# Patient Record
Sex: Female | Born: 1985 | Race: Black or African American | Hispanic: No | Marital: Single | Smoking: Former smoker
Health system: Southern US, Community
[De-identification: ages and names within clinical notes are randomized; demographics above are authoritative.]

## PROBLEM LIST (undated history)

## (undated) ENCOUNTER — Emergency Department (HOSPITAL_COMMUNITY): Discharge status: Absent without leave | Payer: Self-pay

## (undated) DIAGNOSIS — K219 Gastro-esophageal reflux disease without esophagitis: Secondary | ICD-10-CM

## (undated) DIAGNOSIS — R51 Headache: Secondary | ICD-10-CM

## (undated) DIAGNOSIS — R519 Headache, unspecified: Secondary | ICD-10-CM

## (undated) DIAGNOSIS — D649 Anemia, unspecified: Secondary | ICD-10-CM

## (undated) DIAGNOSIS — E119 Type 2 diabetes mellitus without complications: Secondary | ICD-10-CM

## (undated) DIAGNOSIS — N390 Urinary tract infection, site not specified: Secondary | ICD-10-CM

---

## 2012-10-27 ENCOUNTER — Emergency Department (HOSPITAL_COMMUNITY)
Admission: EM | Admit: 2012-10-27 | Discharge: 2012-10-27 | Disposition: A | Discharge status: Home / Self Care | Payer: No Typology Code available for payment source | Attending: Emergency Medicine | Admitting: Emergency Medicine

## 2012-10-27 ENCOUNTER — Encounter (HOSPITAL_COMMUNITY): Payer: Self-pay | Admitting: *Deleted

## 2012-10-27 DIAGNOSIS — Z87891 Personal history of nicotine dependence: Secondary | ICD-10-CM | POA: Insufficient documentation

## 2012-10-27 DIAGNOSIS — Z3490 Encounter for supervision of normal pregnancy, unspecified, unspecified trimester: Secondary | ICD-10-CM

## 2012-10-27 DIAGNOSIS — O9989 Other specified diseases and conditions complicating pregnancy, childbirth and the puerperium: Secondary | ICD-10-CM | POA: Insufficient documentation

## 2012-10-27 DIAGNOSIS — O210 Mild hyperemesis gravidarum: Secondary | ICD-10-CM | POA: Insufficient documentation

## 2012-10-27 DIAGNOSIS — Z79899 Other long term (current) drug therapy: Secondary | ICD-10-CM | POA: Insufficient documentation

## 2012-10-27 DIAGNOSIS — R519 Headache, unspecified: Secondary | ICD-10-CM

## 2012-10-27 DIAGNOSIS — Y9389 Activity, other specified: Secondary | ICD-10-CM | POA: Insufficient documentation

## 2012-10-27 DIAGNOSIS — R51 Headache: Secondary | ICD-10-CM | POA: Insufficient documentation

## 2012-10-27 DIAGNOSIS — Y92410 Unspecified street and highway as the place of occurrence of the external cause: Secondary | ICD-10-CM | POA: Insufficient documentation

## 2012-10-27 DIAGNOSIS — R110 Nausea: Secondary | ICD-10-CM

## 2012-10-27 LAB — POCT I-STAT, CHEM 8
BUN: 5 mg/dL — ABNORMAL LOW (ref 6–23)
Hemoglobin: 11.2 g/dL — ABNORMAL LOW (ref 12.0–15.0)
Sodium: 138 mEq/L (ref 135–145)
TCO2: 22 mmol/L (ref 0–100)

## 2012-10-27 LAB — URINALYSIS, ROUTINE W REFLEX MICROSCOPIC
Bilirubin Urine: NEGATIVE
Glucose, UA: NEGATIVE mg/dL
Hgb urine dipstick: NEGATIVE
Ketones, ur: NEGATIVE mg/dL
Leukocytes, UA: NEGATIVE
Nitrite: NEGATIVE
Protein, ur: NEGATIVE mg/dL
Specific Gravity, Urine: 1.029 (ref 1.005–1.030)
Urobilinogen, UA: 0.2 mg/dL (ref 0.0–1.0)
pH: 6 (ref 5.0–8.0)

## 2012-10-27 LAB — POCT PREGNANCY, URINE: Preg Test, Ur: POSITIVE — AB

## 2012-10-27 MED ORDER — PYRIDOXINE HCL 500 MG PO TABS: 500.0000 mg | ORAL_TABLET | Freq: Every day | ORAL | Status: DC
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
## 2012-10-28 NOTE — ED Provider Notes (Signed)
Medical screening examination/treatment/procedure(s) were performed by non-physician practitioner and as supervising physician I was immediately available for consultation/collaboration.  Jamela Cumbo, MD 10/28/12 2334 

## 2014-02-06 ENCOUNTER — Encounter (HOSPITAL_COMMUNITY): Payer: Self-pay | Admitting: *Deleted

## 2014-08-20 ENCOUNTER — Encounter (HOSPITAL_BASED_OUTPATIENT_CLINIC_OR_DEPARTMENT_OTHER): Payer: Self-pay | Admitting: Emergency Medicine

## 2014-08-20 ENCOUNTER — Emergency Department (HOSPITAL_BASED_OUTPATIENT_CLINIC_OR_DEPARTMENT_OTHER)
Admission: EM | Admit: 2014-08-20 | Discharge: 2014-08-20 | Disposition: A | Discharge status: Home / Self Care | Payer: Medicaid Other | Attending: Emergency Medicine | Admitting: Emergency Medicine

## 2014-08-20 DIAGNOSIS — Z79899 Other long term (current) drug therapy: Secondary | ICD-10-CM | POA: Diagnosis not present

## 2014-08-20 DIAGNOSIS — Z87891 Personal history of nicotine dependence: Secondary | ICD-10-CM | POA: Insufficient documentation

## 2014-08-20 DIAGNOSIS — R21 Rash and other nonspecific skin eruption: Secondary | ICD-10-CM | POA: Insufficient documentation

## 2014-08-20 MED ORDER — FLUCONAZOLE 200 MG PO TABS: 200.0000 mg | ORAL_TABLET | Freq: Every day | ORAL | Status: DC

## 2014-08-20 MED ORDER — SELENIUM SULFIDE 1 % EX LOTN: 1.0000 "application " | TOPICAL_LOTION | Freq: Every day | CUTANEOUS | Status: DC

## 2014-08-20 NOTE — ED Provider Notes (Signed)
CSN: [id number]     Arrival date & time 08/20/14  1729 History  This chart is scribed for non-physician practitioner, Joycie PeekBenjamin Cormick Moss, PA-C, working with Jerelyn ScottMartha Linker, MD by Abel PrestoKara Demonbreun, ED Scribe.  This patient was seen in room MH09/MH09 and the patient's care was started 6:26 PM.     Chief Complaint  Patient presents with  . Rash      The history is provided by the patient. No language interpreter was used.   HPI Comments: Anna Potter is a 29 y.o. female who presents to the Emergency Department complaining of pruritic rash to upper chest extending to top of breasts with onset 3 day ago. Pt states coworker recently had shingles. Pt denies h/o chicken pox and is unsure of her vaccination status.  She denies rash to arms or knees. Pt denies new lotions, detergents, or soaps. She has tried Aveeno lotion for relief. Pt states she just returned from the beach last week. Pt denies pain, numbness, visual disturbances, weakness, SOB, and fever.   History reviewed. No pertinent past medical history. Past Surgical History  Procedure Laterality Date  . Cesarean section     History reviewed. No pertinent family history. History  Substance Use Topics  . Smoking status: Former Games developermoker  . Smokeless tobacco: Not on file  . Alcohol Use: No   OB History    Gravida Para Term Preterm AB TAB SAB Ectopic Multiple Living   1              Review of Systems  Constitutional: Negative for fever and chills.  Eyes: Negative for visual disturbance.  Respiratory: Negative for shortness of breath.   Musculoskeletal: Negative for myalgias.  Skin: Positive for rash.  Neurological: Negative for weakness and numbness.  All other systems reviewed and are negative.     Allergies  Review of patient's allergies indicates no known allergies.  Home Medications   Prior to Admission medications   Medication Sig Start Date End Date Taking? Authorizing Provider  Prenatal Vit-Fe Fumarate-FA (PRENATAL  MULTIVITAMIN) TABS Take 1 tablet by mouth daily at 12 noon.    Historical Provider, MD  pyridoxine (B-6) 500 MG tablet Take 1 tablet (500 mg total) by mouth daily. 10/27/12   Junius FinnerErin O'Malley, PA-C  selenium sulfide (SELSUN) 1 % LOTN Apply 1 application topically daily. 08/20/14   Joycie PeekBenjamin Charnelle Bergeman, PA-C   BP 126/82 mmHg  Pulse 87  Temp(Src) 98.1 F (36.7 C) (Oral)  Resp 18  SpO2 100%  LMP 08/03/2014  Breastfeeding? Unknown Physical Exam  Constitutional:  Awake, alert, nontoxic appearance.  HENT:  Head: Atraumatic.  Eyes: Right eye exhibits no discharge. Left eye exhibits no discharge.  Neck: Neck supple.  Cardiovascular: Normal rate, regular rhythm and normal heart sounds.   Pulmonary/Chest: Effort normal. No respiratory distress. She has no wheezes. She has no rales. She exhibits no tenderness.  Abdominal: Soft. There is no tenderness. There is no rebound.  Musculoskeletal: She exhibits no tenderness.  Baseline ROM, no obvious new focal weakness.  Neurological:  Mental status and motor strength appears baseline for patient and situation.  Skin: No rash noted.  Diffuse maculopapular lesion to anterior chest wall, crossing midline, with some mild scaling. No oozing or drainage. No herpetic lesions.  Psychiatric: She has a normal mood and affect.  Nursing note and vitals reviewed.   ED Course  Procedures (including critical care time) DIAGNOSTIC STUDIES: Oxygen Saturation is 100% on room air, normal by my interpretation.    COORDINATION  OF CARE: 6:28 PM Discussed treatment plan with patient at beside continued use of Aveeno lotion, the patient agrees with the plan and has no further questions at this time.   Labs Review Labs Reviewed - No data to display  Imaging Review No results found.   EKG Interpretation None      MDM  Vitals stable - WNL -afebrile Pt resting comfortably in ED. PE--rash consistent with eczema versus tinea versicolor. Since patient has been treating  with Aveeno without improvement, will treat with topical antifungal. No evidence of zoster. Discussed follow-up with primary care for further evaluation and management of symptoms.  I discussed all relevant lab findings and imaging results with pt and they verbalized understanding. Discussed f/u with PCP within 48 hrs and return precautions, pt very amenable to plan.  Final diagnoses:  Rash   I personally performed the services described in this documentation, which was scribed in my presence. The recorded information has been reviewed and is accurate.     Joycie PeekBenjamin Riot Waterworth, PA-C 08/20/14 2050  Jerelyn ScottMartha Linker, MD 08/20/14 2051
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
## 2014-09-30 ENCOUNTER — Emergency Department (HOSPITAL_COMMUNITY)
Admission: EM | Admit: 2014-09-30 | Discharge: 2014-09-30 | Discharge status: Absent without leave | Payer: Self-pay | Source: Home / Self Care

## 2015-01-10 ENCOUNTER — Encounter (HOSPITAL_BASED_OUTPATIENT_CLINIC_OR_DEPARTMENT_OTHER): Payer: Self-pay

## 2015-01-10 ENCOUNTER — Emergency Department (HOSPITAL_BASED_OUTPATIENT_CLINIC_OR_DEPARTMENT_OTHER)
Admission: EM | Admit: 2015-01-10 | Discharge: 2015-01-10 | Disposition: A | Discharge status: Home / Self Care | Payer: Medicaid Other | Attending: Emergency Medicine | Admitting: Emergency Medicine

## 2015-01-10 ENCOUNTER — Encounter: Payer: Self-pay | Admitting: Family Medicine

## 2015-01-10 DIAGNOSIS — Z79899 Other long term (current) drug therapy: Secondary | ICD-10-CM | POA: Insufficient documentation

## 2015-01-10 DIAGNOSIS — R51 Headache: Secondary | ICD-10-CM | POA: Insufficient documentation

## 2015-01-10 DIAGNOSIS — R519 Headache, unspecified: Secondary | ICD-10-CM

## 2015-01-10 DIAGNOSIS — Z87891 Personal history of nicotine dependence: Secondary | ICD-10-CM | POA: Insufficient documentation

## 2015-01-10 DIAGNOSIS — E119 Type 2 diabetes mellitus without complications: Secondary | ICD-10-CM | POA: Diagnosis not present

## 2015-01-10 HISTORY — DX: Type 2 diabetes mellitus without complications: E11.9

## 2015-01-10 MED ORDER — METOCLOPRAMIDE HCL 5 MG/ML IJ SOLN
10.0000 mg | Freq: Once | INTRAMUSCULAR | Status: AC
  Administered 2015-01-10: 10 mg via INTRAVENOUS
  Filled 2015-01-10: qty 2

## 2015-01-10 MED ORDER — SODIUM CHLORIDE 0.9 % IV BOLUS (SEPSIS)
1000.0000 mL | Freq: Once | INTRAVENOUS | Status: AC
  Administered 2015-01-10: 1000 mL via INTRAVENOUS

## 2015-01-10 MED ORDER — DEXAMETHASONE SODIUM PHOSPHATE 10 MG/ML IJ SOLN
10.0000 mg | Freq: Once | INTRAMUSCULAR | Status: DC
  Filled 2015-01-10: qty 1

## 2015-01-10 MED ORDER — DIPHENHYDRAMINE HCL 50 MG/ML IJ SOLN
25.0000 mg | Freq: Once | INTRAMUSCULAR | Status: AC
  Administered 2015-01-10: 25 mg via INTRAVENOUS
  Filled 2015-01-10: qty 1

## 2015-01-10 NOTE — Discharge Instructions (Signed)

## 2015-01-10 NOTE — ED Provider Notes (Signed)
CSN: [id number]     Arrival date & time 01/10/15  1539 History   First MD Initiated Contact with Patient 01/10/15 1647     Chief Complaint  Patient presents with  . Headache     (Consider location/radiation/quality/duration/timing/severity/associated sxs/prior Treatment) Patient is a 29 y.o. female presenting with headaches. The history is provided by the patient.  Headache Pain location:  L temporal Quality:  Dull Radiates to:  Does not radiate Onset quality:  Gradual Timing:  Constant Progression:  Unchanged Chronicity:  New Similar to prior headaches: no   Context: not caffeine, not coughing and not intercourse   Relieved by:  Nothing Worsened by:  Nothing Ineffective treatments:  Acetaminophen Associated symptoms: no abdominal pain, no cough, no fever, no nausea, no visual change (No double vision) and no vomiting     Past Medical History  Diagnosis Date  . Diabetes mellitus without complication [facility])    Past Surgical History  Procedure Laterality Date  . Cesarean section     No family history on file. Social History  Substance Use Topics  . Smoking status: Former Games developer  . Smokeless tobacco: None  . Alcohol Use: No   OB History    Gravida Para Term Preterm AB TAB SAB Ectopic Multiple Living   2              Review of Systems  Constitutional: Negative for fever.  Respiratory: Negative for cough and shortness of breath.   Gastrointestinal: Negative for nausea, vomiting and abdominal pain.  Neurological: Positive for headaches.  All other systems reviewed and are negative.     Allergies  Review of patient's allergies indicates no known allergies.  Home Medications   Prior to Admission medications   Medication Sig Start Date End Date Taking? Authorizing Provider  Prenatal Vit-Fe Fumarate-FA (PRENATAL MULTIVITAMIN) TABS Take 1 tablet by mouth daily at 12 noon.    Historical Provider, MD   BP 111/76 mmHg  Pulse 84  Temp(Src) 98.1 F (36.7 C) (Oral)   Resp 18  Ht  (1.676 m)  Wt 180 lb (81.647 kg)  BMI 29.07 kg/m2  SpO2 100%  LMP  (LMP Unknown) Physical Exam  Constitutional: She is oriented to person, place, and time. She appears well-developed and well-nourished. No distress.  HENT:  Head: Normocephalic and atraumatic.  Mouth/Throat: Oropharynx is clear and moist.  Eyes: EOM are normal. Pupils are equal, round, and reactive to light.  Neck: Normal range of motion. Neck supple.  Cardiovascular: Normal rate and regular rhythm.  Exam reveals no friction rub.   No murmur heard. Pulmonary/Chest: Effort normal and breath sounds normal. No respiratory distress. She has no wheezes. She has no rales.  Abdominal: Soft. She exhibits no distension. There is no tenderness. There is no rebound.  Musculoskeletal: Normal range of motion. She exhibits no edema.  Neurological: She is alert and oriented to person, place, and time.  Skin: No rash noted. She is not diaphoretic.  Nursing note and vitals reviewed.   ED Course  Procedures (including critical care time) Labs Review Labs Reviewed - No data to display  Imaging Review No results found. I have personally reviewed and evaluated these images and lab results as part of my medical decision-making.   EKG Interpretation None      MDM   Final diagnoses:  Acute nonintractable headache, unspecified headache type    29 year old female who is roughly [redacted] weeks pregnant presents with headache. Present for 3 days. No history of  headaches with prior pregnancy. She is not hypertensive. Denies any vision changes, nausea, vomiting, weakness, numbness. She is well appearing here. No fever. Vitals are stable and neurologically she is intact. She is first trimester, so no concern for preeclampsia. We'll get headache cocktail.  HA improved after cocktail, sleeping comfortably. Stable for discharge.  Elwin Mocha, MD 01/10/15 [phone]

## 2015-01-10 NOTE — ED Notes (Signed)
HA x 3 days-pt is ?weeks pregnant-positive pregnancy test at [facility] 2 weeks ago-no [place] and no OB

## 2015-01-15 ENCOUNTER — Ambulatory Visit: Payer: Self-pay | Admitting: Obstetrics & Gynecology

## 2015-01-15 ENCOUNTER — Encounter: Payer: Self-pay | Admitting: Obstetrics & Gynecology

## 2015-01-15 ENCOUNTER — Other Ambulatory Visit (HOSPITAL_COMMUNITY)
Admission: RE | Admit: 2015-01-15 | Discharge: 2015-01-15 | Disposition: A | Discharge status: Home / Self Care | Payer: Medicaid Other | Source: Ambulatory Visit | Attending: Obstetrics & Gynecology | Admitting: Obstetrics & Gynecology

## 2015-01-15 ENCOUNTER — Ambulatory Visit (INDEPENDENT_AMBULATORY_CARE_PROVIDER_SITE_OTHER): Payer: Medicaid Other | Admitting: Obstetrics & Gynecology

## 2015-01-15 VITALS — BP 116/68 | HR 91 | Wt 183.0 lb

## 2015-01-15 DIAGNOSIS — N760 Acute vaginitis: Secondary | ICD-10-CM | POA: Diagnosis present

## 2015-01-15 DIAGNOSIS — O34219 Maternal care for unspecified type scar from previous cesarean delivery: Secondary | ICD-10-CM | POA: Insufficient documentation

## 2015-01-15 DIAGNOSIS — O99211 Obesity complicating pregnancy, first trimester: Secondary | ICD-10-CM | POA: Diagnosis not present

## 2015-01-15 DIAGNOSIS — Z23 Encounter for immunization: Secondary | ICD-10-CM

## 2015-01-15 DIAGNOSIS — E669 Obesity, unspecified: Secondary | ICD-10-CM | POA: Diagnosis not present

## 2015-01-15 DIAGNOSIS — O09291 Supervision of pregnancy with other poor reproductive or obstetric history, first trimester: Secondary | ICD-10-CM | POA: Insufficient documentation

## 2015-01-15 DIAGNOSIS — Z01419 Encounter for gynecological examination (general) (routine) without abnormal findings: Secondary | ICD-10-CM | POA: Insufficient documentation

## 2015-01-15 DIAGNOSIS — Z8632 Personal history of gestational diabetes: Secondary | ICD-10-CM

## 2015-01-15 DIAGNOSIS — Z3481 Encounter for supervision of other normal pregnancy, first trimester: Secondary | ICD-10-CM

## 2015-01-15 DIAGNOSIS — O0933 Supervision of pregnancy with insufficient antenatal care, third trimester: Secondary | ICD-10-CM

## 2015-01-15 DIAGNOSIS — Z113 Encounter for screening for infections with a predominantly sexual mode of transmission: Secondary | ICD-10-CM | POA: Insufficient documentation

## 2015-01-15 LAB — HEMOGLOBIN A1C
Hgb A1c MFr Bld: 5.8 % — ABNORMAL HIGH (ref <5.7)
MEAN PLASMA GLUCOSE: 120 mg/dL — AB (ref <117)
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
## 2015-01-16 LAB — OBSTETRIC PANEL
ANTIBODY SCREEN: NEGATIVE
BASOS PCT: 0 % (ref 0–1)
Basophils Absolute: 0 10*3/uL (ref 0.0–0.1)
EOS ABS: 0.2 10*3/uL (ref 0.0–0.7)
EOS PCT: 2 % (ref 0–5)
HEMATOCRIT: 30.3 % — AB (ref 36.0–46.0)
HEMOGLOBIN: 9.7 g/dL — AB (ref 12.0–15.0)
Hepatitis B Surface Ag: NEGATIVE
Lymphocytes Relative: 31 % (ref 12–46)
Lymphs Abs: 2.5 10*3/uL (ref 0.7–4.0)
MCH: 27 pg (ref 26.0–34.0)
MCHC: 32 g/dL (ref 30.0–36.0)
MCV: 84.4 fL (ref 78.0–100.0)
MONO ABS: 0.6 10*3/uL (ref 0.1–1.0)
MONOS PCT: 8 % (ref 3–12)
MPV: 12.3 fL (ref 8.6–12.4)
Neutro Abs: 4.8 10*3/uL (ref 1.7–7.7)
Neutrophils Relative %: 59 % (ref 43–77)
Platelets: 223 10*3/uL (ref 150–400)
RBC: 3.59 MIL/uL — ABNORMAL LOW (ref 3.87–5.11)
RDW: 13.7 % (ref 11.5–15.5)
RH TYPE: POSITIVE
Rubella: 1.42 Index — ABNORMAL HIGH (ref <0.90)
WBC: 8.1 10*3/uL (ref 4.0–10.5)

## 2015-01-16 LAB — GLUCOSE TOLERANCE, 1 HOUR (50G) W/O FASTING: GLUCOSE 1 HOUR GTT: 105 mg/dL (ref 70–140)

## 2015-01-16 LAB — CYTOLOGY - PAP

## 2015-01-17 LAB — CULTURE, OB URINE
Colony Count: NO GROWTH
Organism ID, Bacteria: NO GROWTH

## 2015-01-23 ENCOUNTER — Telehealth: Payer: Self-pay

## 2015-01-23 MED ORDER — FUSION PLUS PO CAPS: 1.0000 | ORAL_CAPSULE | Freq: Every day | ORAL | Status: DC

## 2015-01-23 NOTE — Telephone Encounter (Signed)
-----   Message from Allie BossierMyra C Dove, MD sent at 01/16/2015  3:34 PM EDT ----- She will need a prescription for Fusion Plus iron daily. Thnaks

## 2015-01-23 NOTE — Telephone Encounter (Signed)
Left message for patient that prescription has been sent to her pharmacy. Armandina StammerJennifer Howard RN BSN

## 2015-01-24 ENCOUNTER — Encounter: Payer: Self-pay | Admitting: Obstetrics & Gynecology

## 2015-01-24 ENCOUNTER — Ambulatory Visit (INDEPENDENT_AMBULATORY_CARE_PROVIDER_SITE_OTHER): Payer: Medicaid Other | Admitting: Obstetrics & Gynecology

## 2015-01-24 VITALS — BP 112/48 | HR 80 | Wt 184.0 lb

## 2015-01-24 DIAGNOSIS — Z3481 Encounter for supervision of other normal pregnancy, first trimester: Secondary | ICD-10-CM | POA: Diagnosis not present

## 2015-01-24 MED ORDER — PRENATAL MULTIVITAMIN CH: 1.0000 | ORAL_TABLET | Freq: Every day | ORAL | Status: AC

## 2015-01-24 NOTE — Progress Notes (Signed)
Subjective:  Anna Potter is a 29 y.o. G3P2002 at 6370w3d being seen today for ongoing prenatal care.  Patient reports no complaints.  Contractions: Not present.  Vag. Bleeding: None. Movement: Absent. Denies leaking of fluid.   The following portions of the patient's history were reviewed and updated as appropriate: allergies, current medications, past family history, past medical history, past social history, past surgical history and problem list. Problem list updated.  Objective:   Filed Vitals:   01/24/15 1500  BP: 112/48  Pulse: 80  Weight: 184 lb (83.462 kg)    Fetal Status: Fetal Heart Rate (bpm): 156 Fundal Height: 27 cm Movement: Absent     General:  Alert, oriented and cooperative. Patient is in no acute distress.  Skin: Skin is warm and dry. No rash noted.   Cardiovascular: Normal heart rate noted  Respiratory: Normal respiratory effort, no problems with respiration noted  Abdomen: Soft, gravid, appropriate for gestational age. Pain/Pressure: Absent     Pelvic: Vag. Bleeding: None Vag D/C Character: Thin   Cervical exam deferred        Extremities: Normal range of motion.  Edema: None  Mental Status: Normal mood and affect. Normal behavior. Normal judgment and thought content.   Urinalysis: Urine Protein: Negative Urine Glucose: Negative  Assessment and Plan:  Pregnancy: G3P2002 at 3870w3d  1. Encounter for supervision of other normal pregnancy in first trimester - Schedule anatomy u/s  Preterm labor symptoms and general obstetric precautions including but not limited to vaginal bleeding, contractions, leaking of fluid and fetal movement were reviewed in detail with the patient. Please refer to After Visit Summary for other counseling recommendations.  Return in about 3 weeks (around 02/14/2015).   Allie BossierMyra C Laraine Samet, MD

## 2015-01-24 NOTE — Progress Notes (Signed)
Bedside ultrasound done and femur length 4.57 cm. GA 25.1. Armandina StammerJennifer Howard RN BSN

## 2015-01-25 ENCOUNTER — Ambulatory Visit (HOSPITAL_COMMUNITY)
Admission: RE | Admit: 2015-01-25 | Discharge: 2015-01-25 | Disposition: A | Discharge status: Home / Self Care | Payer: Medicaid Other | Source: Ambulatory Visit | Attending: Obstetrics & Gynecology | Admitting: Obstetrics & Gynecology

## 2015-01-25 ENCOUNTER — Other Ambulatory Visit: Payer: Self-pay | Admitting: Obstetrics & Gynecology

## 2015-01-25 ENCOUNTER — Encounter (HOSPITAL_COMMUNITY): Payer: Self-pay

## 2015-01-25 DIAGNOSIS — O0932 Supervision of pregnancy with insufficient antenatal care, second trimester: Secondary | ICD-10-CM

## 2015-01-25 DIAGNOSIS — Z36 Encounter for antenatal screening of mother: Secondary | ICD-10-CM | POA: Diagnosis not present

## 2015-01-25 DIAGNOSIS — O09293 Supervision of pregnancy with other poor reproductive or obstetric history, third trimester: Secondary | ICD-10-CM

## 2015-01-25 DIAGNOSIS — Z8632 Personal history of gestational diabetes: Secondary | ICD-10-CM

## 2015-01-25 DIAGNOSIS — Z3689 Encounter for other specified antenatal screening: Secondary | ICD-10-CM

## 2015-01-25 DIAGNOSIS — Z3481 Encounter for supervision of other normal pregnancy, first trimester: Secondary | ICD-10-CM

## 2015-01-25 DIAGNOSIS — O34219 Maternal care for unspecified type scar from previous cesarean delivery: Secondary | ICD-10-CM | POA: Diagnosis not present

## 2015-01-25 DIAGNOSIS — Z315 Encounter for genetic counseling: Secondary | ICD-10-CM | POA: Diagnosis not present

## 2015-01-25 DIAGNOSIS — O99212 Obesity complicating pregnancy, second trimester: Secondary | ICD-10-CM

## 2015-01-25 DIAGNOSIS — O358XX0 Maternal care for other (suspected) fetal abnormality and damage, not applicable or unspecified: Secondary | ICD-10-CM

## 2015-01-25 DIAGNOSIS — O09291 Supervision of pregnancy with other poor reproductive or obstetric history, first trimester: Secondary | ICD-10-CM

## 2015-01-25 DIAGNOSIS — E669 Obesity, unspecified: Secondary | ICD-10-CM | POA: Diagnosis not present

## 2015-01-25 DIAGNOSIS — Z3492 Encounter for supervision of normal pregnancy, unspecified, second trimester: Secondary | ICD-10-CM

## 2015-01-25 DIAGNOSIS — Z3A24 24 weeks gestation of pregnancy: Secondary | ICD-10-CM | POA: Insufficient documentation

## 2015-01-25 DIAGNOSIS — IMO0002 Reserved for concepts with insufficient information to code with codable children: Secondary | ICD-10-CM | POA: Insufficient documentation

## 2015-01-25 DIAGNOSIS — O99211 Obesity complicating pregnancy, first trimester: Secondary | ICD-10-CM

## 2015-01-25 DIAGNOSIS — O0933 Supervision of pregnancy with insufficient antenatal care, third trimester: Secondary | ICD-10-CM

## 2015-01-25 LAB — CHROMOSOMES ANALYSIS FOR CF

## 2015-01-25 NOTE — Progress Notes (Addendum)
Genetic Counseling  High-Risk Gestation Note  Appointment Date:  01/25/2015 Referred By: Allie Bossierove, Myra C, MD Date of Birth:  09/18/1985   Pregnancy History: [id number] Estimated Date of Delivery: 05/08/15 Estimated Gestational Age: 1967w2d Attending: Ledon SnareBrian Brost, MD   Ms. Julius BowelsLashonda Potter was seen for genetic counseling because of the ultrasound finding of fetal echogenic bowel.    In Summary:   Fetal Echogenic bowel on ultrasound today  Discussed association with fetal infections, cystic fibrosis, and Down syndrome; see additional details below  Patient elected to pursue NIPS (Panorama) today, declined amniocentesis  Also elected to pursue cystic fibrosis carrier screening and CMV and Toxo maternal serology studies today  Follow-up ultrasound scheduled for 02/22/15  Ms. Anna Potter was seen for detailed ultrasound today and fetal echogenic bowel was visualized. The ultrasound report will be sent under separate cover.  We discussed that the second trimester genetic sonogram is targeted at identifying features associated with aneuploidy.  It has evolved as a screening tool used to provide an individualized risk assessment for Down syndrome and other trisomies.  The ability of sonography to aid in the detection of aneuploidies relies on identification of both major structural anomalies and "soft markers."  The patient was counseled that the latter term refers to findings that are often normal variants and do not cause any significant medical problems.  Nonetheless, these markers have a known association with aneuploidy.     We discussed that an echogenic bowel refers to an area in the fetal intestines that appears brighter, or denser, than the liver and/or bone on ultrasound. Echogenic bowel is detected in approximately 1% of fetuses at the time of a second trimester ultrasound evaluation.  Most of the time, this brightness does not cause any problems and will spontaneously resolve.  Possible causes of an  echogenic bowel include undergrowth of the bowel, an obstruction or blockage of the intestines, the fetus swallowing a small amount of blood present in the amniotic fluid, an intrauterine infection, a chromosome condition, cystic fibrosis, or a normal variation in development.   Ms. Anna Potter reported no bleeding during the pregnancy and denied known exposure to infections during the pregnancy. We briefly reviewed cystic fibrosis (CF) including the typical features and prognosis and the autosomal recessive inheritance of the condition. The carrier frequency in the African American population is approximately 1 in 7065.  The presence of echogenic bowel on prenatal ultrasound is associated with an increased risk for CF. We discussed the option of carrier screening for CF and that a negative screen reduces but does not eliminate the chance to be a CF carrier. Prenatal diagnosis for CF would be available via amniocentesis if both parents were identified to be CF carriers. We also reviewed that CF is included on the newborn screening panel in [place].   We discussed that the presence of echogenic bowel on ultrasound increases the chance for fetal Down syndrome from the patient's age related risk of approximately 1 in 800 to approximately 1 in 133 (0.8%).  We reviewed other available screening and diagnostic options including prenatal cell free DNA testing and amniocentesis.  We reviewed the methodology of NIPS, the conditions for which it assesses, and the detection rates and false positive rates. We reviewed that while NIPS is highly sensitive and specific, if is not diagnostic.  We discussed the diagnostic testing option of amniocentesis. We discussed the approximate 1 in 300-500 risk for complications from amniocentesis, including spontaneous pregnancy loss. We reviewed that in addition to chromosome analysis, infection  studies can also be performed on amniotic fluid. We discussed the risks, limitations, and benefits of  each. After consideration of all the options, she elected to proceed with NIPS (Panorama), maternal TORCH titers (CMV and toxoplasmosis), and cystic fibrosis carrier screening. The patient declined amniocentesis at this time. She understands that ultrasound and screening cannot rule out all birth defects or genetic syndromes. A follow-up ultrasound was planned in 4 weeks.   Both family histories were briefly reviewed given that the patient was not initially scheduled for genetic counseling and found to be contributory for intellectual disability for the patient's paternal first cousin once removed (her paternal great-aunt's daughter). The patient did not have information regarding an underlying cause for this relative. There are many different causes of intellectual disabilities including environmental, multifactorial, and genetic etiologies.  We discussed that a specific diagnosis for intellectual disability can be determined in approximately 50% of these individuals.  In the remaining 50% of individuals, a diagnosis may never be determined.  Regarding genetic causes, chromosome aberrations (aneuploidy, deletions, duplications, insertions, and translocations) are responsible for a small percentage of individuals with intellectual disability.  Many individuals with chromosome aberrations have additional differences, including congenital anomalies or minor dysmorphisms.  Likewise, single gene conditions are the underlying cause of intellectual delay in some families.  Many gene conditions have intellectual disability as a feature, but also often include other physical or medical differences. We discussed that without more specific information, it is difficult to provide an accurate risk assessment.  Further genetic counseling is warranted if more information is obtained. Without further information regarding the provided family history, an accurate genetic risk cannot be calculated. Further genetic counseling is  warranted if more information is obtained.  I counseled Anna Potter regarding the above risks and available options.  The approximate face-to-face time with the genetic counselor was 40 minutes.    Quinn Plowman, MS Certified Genetic Counselor 01/25/2015

## 2015-01-26 LAB — TOXOPLASMA ANTIBODIES- IGG AND  IGM

## 2015-01-26 LAB — CMV ANTIBODY, IGG (EIA): CMV AB - IGG: 4.3 U/mL — AB (ref 0.00–0.59)

## 2015-01-26 LAB — CMV IGM: CMV IgM: <30 AU/mL (ref 0.0–29.9)

## 2015-02-01 ENCOUNTER — Telehealth (HOSPITAL_COMMUNITY): Payer: Self-pay | Admitting: MS"

## 2015-02-01 NOTE — Telephone Encounter (Signed)
Called Julius BowelsLashonda Needham to discuss her prenatal cell free DNA test results.  Ms. Julius BowelsLashonda Orlich [facility].  Testing was offered because of fetal echogenic bowel.   The patient was identified by name and DOB.  We reviewed that these are within normal limits, showing a less than 1 in 10,000 risk for trisomies 21, 18 and 13, and monosomy X (Turner syndrome).  In addition, the risk for triploidy/vanishing twin and sex chromosome trisomies (47,XXX and 47,XXY) was also low risk. We reviewed that this testing identifies > 99% of pregnancies with trisomy 621, trisomy 6213, sex chromosome trisomies (47,XXX and 47,XXY), and triploidy. The detection rate for trisomy 18 is 96%.  The detection rate for monosomy X is ~92%.  The false positive rate is <0.1% for all conditions. Testing was also consistent with female fetal sex.  The patient did wish to know fetal sex.  She understands that this testing does not identify all genetic conditions.   Additionally, discussed that maternal CMV and toxoplasmosis serology studies were within normal range. Cystic fibrosis carrier screening is still pending.  All questions were answered to her satisfaction, she was encouraged to call with additional questions or concerns.  Quinn PlowmanKaren Teriana Danker, MS Certified Genetic Counselor 02/01/2015 1:02 PM

## 2015-02-09 ENCOUNTER — Telehealth (HOSPITAL_COMMUNITY): Payer: Self-pay | Admitting: MS"

## 2015-02-09 NOTE — Telephone Encounter (Signed)
Called Ms. Julius BowelsLashonda Burley regarding results of CF carrier screening. Carrier screening for cystic fibrosis yielded a normal/negative result for the most common disease-causing mutations through [facility], meaning that the risk to be a CF carrier can be reduced from 1 in 61 to approximately 1 in 197.  Therefore, the risk for cystic fibrosis in her current pregnancy is significantly reduced. The patient understands that CF carrier screening can reduce but not eliminate the chance to be a CF carrier.   Clydie BraunKaren Chayce Robbins 02/09/2015 9:08 AM

## 2015-02-14 ENCOUNTER — Encounter: Payer: Medicaid Other | Admitting: Family Medicine

## 2015-02-16 ENCOUNTER — Other Ambulatory Visit (HOSPITAL_COMMUNITY): Payer: Self-pay

## 2015-02-21 ENCOUNTER — Ambulatory Visit (INDEPENDENT_AMBULATORY_CARE_PROVIDER_SITE_OTHER): Payer: Medicaid Other | Admitting: Family Medicine

## 2015-02-21 VITALS — BP 105/64 | HR 90 | Wt 187.0 lb

## 2015-02-21 DIAGNOSIS — Z3481 Encounter for supervision of other normal pregnancy, first trimester: Secondary | ICD-10-CM | POA: Diagnosis not present

## 2015-02-21 DIAGNOSIS — L309 Dermatitis, unspecified: Secondary | ICD-10-CM | POA: Diagnosis not present

## 2015-02-21 DIAGNOSIS — O09291 Supervision of pregnancy with other poor reproductive or obstetric history, first trimester: Secondary | ICD-10-CM | POA: Diagnosis not present

## 2015-02-21 DIAGNOSIS — Z8632 Personal history of gestational diabetes: Secondary | ICD-10-CM

## 2015-02-21 LAB — CBC
HCT: 28.9 % — ABNORMAL LOW (ref 36.0–46.0)
Hemoglobin: 9.3 g/dL — ABNORMAL LOW (ref 12.0–15.0)
MCH: 27.3 pg (ref 26.0–34.0)
MCHC: 32.2 g/dL (ref 30.0–36.0)
MCV: 84.8 fL (ref 78.0–100.0)
MPV: 12.3 fL (ref 8.6–12.4)
PLATELETS: 216 10*3/uL (ref 150–400)
RBC: 3.41 MIL/uL — AB (ref 3.87–5.11)
RDW: 14.4 % (ref 11.5–15.5)
WBC: 8.9 10*3/uL (ref 4.0–10.5)

## 2015-02-21 MED ORDER — TRIAMCINOLONE ACETONIDE 0.1 % EX CREA: 1.0000 "application " | TOPICAL_CREAM | Freq: Two times a day (BID) | CUTANEOUS | Status: AC

## 2015-02-21 NOTE — Addendum Note (Signed)
Addended by: Anell BarrHOWARD, JENNIFER L on: 02/21/2015 03:55 PM   Modules accepted: Orders

## 2015-02-21 NOTE — Patient Instructions (Signed)
Eczema Eczema, also called atopic dermatitis, is a skin disorder that causes inflammation of the skin. It causes a red rash and dry, scaly skin. The skin becomes very itchy. Eczema is generally worse during the cooler winter months and often improves with the warmth of summer. Eczema usually starts showing signs in infancy. Some children outgrow eczema, but it may last through adulthood.  CAUSES  The exact cause of eczema is not known, but it appears to run in families. People with eczema often have a family history of eczema, allergies, asthma, or hay fever. Eczema is not contagious. Flare-ups of the condition may be caused by:   Contact with something you are sensitive or allergic to.   Stress. SIGNS AND SYMPTOMS  Dry, scaly skin.   Red, itchy rash.   Itchiness. This may occur before the skin rash and may be very intense.  DIAGNOSIS  The diagnosis of eczema is usually made based on symptoms and medical history. TREATMENT  Eczema cannot be cured, but symptoms usually can be controlled with treatment and other strategies. A treatment plan might include:  Controlling the itching and scratching.   Use over-the-counter antihistamines as directed for itching. This is especially useful at night when the itching tends to be worse.   Use over-the-counter steroid creams as directed for itching.   Avoid scratching. Scratching makes the rash and itching worse. It may also result in a skin infection (impetigo) due to a break in the skin caused by scratching.   Keeping the skin well moisturized with creams every day. This will seal in moisture and help prevent dryness. Lotions that contain alcohol and water should be avoided because they can dry the skin.   Limiting exposure to things that you are sensitive or allergic to (allergens).   Recognizing situations that cause stress.   Developing a plan to manage stress.  HOME CARE INSTRUCTIONS   Only take over-the-counter or  prescription medicines as directed by your health care provider.   Do not use anything on the skin without checking with your health care provider.   Keep baths or showers short (5 minutes) in warm (not hot) water. Use mild cleansers for bathing. These should be unscented. You may add nonperfumed bath oil to the bath water. It is best to avoid soap and bubble bath.   Immediately after a bath or shower, when the skin is still damp, apply a moisturizing ointment to the entire body. This ointment should be a petroleum ointment. This will seal in moisture and help prevent dryness. The thicker the ointment, the better. These should be unscented.   Keep fingernails cut short. Children with eczema may need to wear soft gloves or mittens at night after applying an ointment.   Dress in clothes made of cotton or cotton blends. Dress lightly, because heat increases itching.   A child with eczema should stay away from anyone with fever blisters or cold sores. The virus that causes fever blisters (herpes simplex) can cause a serious skin infection in children with eczema. SEEK MEDICAL CARE IF:   Your itching interferes with sleep.   Your rash gets worse or is not better within 1 week after starting treatment.   You see pus or soft yellow scabs in the rash area.   You have a fever.   You have a rash flare-up after contact with someone who has fever blisters.    This information is not intended to replace advice given to you by your health care   provider. Make sure you discuss any questions you have with your health care provider.   Document Released: 03/21/2000 Document Revised: 01/12/2013 Document Reviewed: 10/25/2012 Elsevier Interactive Patient Education 2016 Elsevier Inc.  

## 2015-02-21 NOTE — Progress Notes (Signed)
Subjective:  Anna Potter is a 29 y.o. G3P2002 at 6044w5d being seen today for ongoing prenatal care.  Patient reports dry, itchy rash that is intermittent.  Located on lower abdomen, neck, and chest.  Has been putting on Eucerin cream, which soothes it, but does not take it away.  Worse in the summer time.  .  Contractions: Not present.  Vag. Bleeding: None. Movement: Present. Denies leaking of fluid.   The following portions of the patient's history were reviewed and updated as appropriate: allergies, current medications, past family history, past medical history, past social history, past surgical history and problem list. Problem list updated.  Objective:   Filed Vitals:   02/21/15 1328  BP: 105/64  Pulse: 90  Weight: 187 lb (84.823 kg)    Fetal Status: Fetal Heart Rate (bpm): 145   Movement: Present     General:  Alert, oriented and cooperative. Patient is in no acute distress.  Skin: Skin is warm and dry. No rash noted.   Cardiovascular: Normal heart rate noted  Respiratory: Normal respiratory effort, no problems with respiration noted  Abdomen: Soft, gravid, appropriate for gestational age. Pain/Pressure: Absent   Dry, slightly erythemic skin consistent with eczema.  Pelvic: Vag. Bleeding: None Vag D/C Character: Thin   Cervical exam deferred        Extremities: Normal range of motion.  Edema: None  Mental Status: Normal mood and affect. Normal behavior. Normal judgment and thought content.   Urinalysis: Urine Protein: Trace Urine Glucose: Negative  Assessment and Plan:  Pregnancy: G3P2002 at 4944w5d  1. Encounter for supervision of other normal pregnancy in first trimester FHT and fundal height normal 28 week labs today  2. History of gestational diabetes in prior pregnancy, currently pregnant in first trimester Repeat 1hr GTT today  3. Eczema Triamcinolone cream twice a day until resolved, then apply eucerin cream to control.  Discussed using mild soaps, less frequent  showering.  Handout given.  Preterm labor symptoms and general obstetric precautions including but not limited to vaginal bleeding, contractions, leaking of fluid and fetal movement were reviewed in detail with the patient. Please refer to After Visit Summary for other counseling recommendations.  No Follow-up on file.   Levie HeritageJacob J Dymphna Wadley, DO

## 2015-02-22 ENCOUNTER — Telehealth: Payer: Self-pay

## 2015-02-22 ENCOUNTER — Other Ambulatory Visit (HOSPITAL_COMMUNITY): Payer: Self-pay | Admitting: Obstetrics and Gynecology

## 2015-02-22 ENCOUNTER — Encounter (HOSPITAL_COMMUNITY): Payer: Self-pay

## 2015-02-22 ENCOUNTER — Ambulatory Visit (HOSPITAL_COMMUNITY)
Admission: RE | Admit: 2015-02-22 | Discharge: 2015-02-22 | Disposition: A | Discharge status: Home / Self Care | Payer: Medicaid Other | Source: Ambulatory Visit | Attending: Obstetrics & Gynecology | Admitting: Obstetrics & Gynecology

## 2015-02-22 DIAGNOSIS — Z3A26 26 weeks gestation of pregnancy: Secondary | ICD-10-CM

## 2015-02-22 DIAGNOSIS — Z8632 Personal history of gestational diabetes: Secondary | ICD-10-CM

## 2015-02-22 DIAGNOSIS — O0933 Supervision of pregnancy with insufficient antenatal care, third trimester: Secondary | ICD-10-CM

## 2015-02-22 DIAGNOSIS — O99213 Obesity complicating pregnancy, third trimester: Secondary | ICD-10-CM

## 2015-02-22 DIAGNOSIS — O34219 Maternal care for unspecified type scar from previous cesarean delivery: Secondary | ICD-10-CM

## 2015-02-22 DIAGNOSIS — O09299 Supervision of pregnancy with other poor reproductive or obstetric history, unspecified trimester: Secondary | ICD-10-CM | POA: Insufficient documentation

## 2015-02-22 DIAGNOSIS — O358XX0 Maternal care for other (suspected) fetal abnormality and damage, not applicable or unspecified: Secondary | ICD-10-CM | POA: Insufficient documentation

## 2015-02-22 DIAGNOSIS — O283 Abnormal ultrasonic finding on antenatal screening of mother: Secondary | ICD-10-CM | POA: Diagnosis not present

## 2015-02-22 DIAGNOSIS — R7309 Other abnormal glucose: Secondary | ICD-10-CM

## 2015-02-22 LAB — HIV ANTIBODY (ROUTINE TESTING W REFLEX): HIV: NONREACTIVE

## 2015-02-22 LAB — RPR

## 2015-02-22 LAB — GLUCOSE TOLERANCE, 1 HOUR (50G) W/O FASTING: GLUCOSE 1 HOUR GTT: 182 mg/dL — AB (ref 70–140)

## 2015-02-22 NOTE — Telephone Encounter (Signed)
Attempted to reach patient regarding elevated 1 hour gtt.  Need to schedule 3 hr gtt- unable to leave message as "mailbox is full". Armandina StammerJennifer Howard RN BSN

## 2015-02-23 NOTE — Telephone Encounter (Signed)
Contacted patient and made her aware that she did not pass her one hour test and she will need a three hour gtt. Patient states understanding. Patient states she can come do it Tuesday morning (02-27-15). Patient made aware to be fasting after midnight Monday night. Patient states understanding. Anna StammerJennifer Howard RN BSN

## 2015-03-05 ENCOUNTER — Telehealth: Payer: Self-pay

## 2015-03-05 NOTE — Telephone Encounter (Signed)
Left message on home phone to call office back- patient didn't show for 3 hour gtt. Armandina StammerJennifer Howard RN BSN

## 2015-03-08 ENCOUNTER — Other Ambulatory Visit (HOSPITAL_COMMUNITY): Payer: Self-pay

## 2015-03-14 ENCOUNTER — Encounter: Payer: Medicaid Other | Admitting: Obstetrics & Gynecology

## 2015-03-14 NOTE — Telephone Encounter (Signed)
Patient did not show for appointment on 03-14-15 and missed Three hour gtt. Patient called and left message to please call our office back to reschedule these. Armandina StammerJennifer Howard RNBSN

## 2015-03-20 NOTE — Telephone Encounter (Signed)
Attempted to reach patient again and left message for her to call our office back. Letter sent to home for patient to contact office to schedule appointment. Armandina StammerJennifer Jiselle Sheu RN BSN

## 2015-03-23 ENCOUNTER — Ambulatory Visit (HOSPITAL_COMMUNITY)
Admission: RE | Admit: 2015-03-23 | Discharge: 2015-03-23 | Disposition: A | Discharge status: Home / Self Care | Payer: Medicaid Other | Source: Ambulatory Visit | Attending: Obstetrics & Gynecology | Admitting: Obstetrics & Gynecology

## 2015-03-23 ENCOUNTER — Other Ambulatory Visit (HOSPITAL_COMMUNITY): Payer: Self-pay | Admitting: Maternal and Fetal Medicine

## 2015-03-23 DIAGNOSIS — Z3A33 33 weeks gestation of pregnancy: Secondary | ICD-10-CM

## 2015-03-23 DIAGNOSIS — O0933 Supervision of pregnancy with insufficient antenatal care, third trimester: Secondary | ICD-10-CM

## 2015-03-23 DIAGNOSIS — O99213 Obesity complicating pregnancy, third trimester: Secondary | ICD-10-CM | POA: Insufficient documentation

## 2015-03-23 DIAGNOSIS — O34219 Maternal care for unspecified type scar from previous cesarean delivery: Secondary | ICD-10-CM

## 2015-03-23 DIAGNOSIS — O283 Abnormal ultrasonic finding on antenatal screening of mother: Secondary | ICD-10-CM | POA: Insufficient documentation

## 2015-03-23 DIAGNOSIS — O09293 Supervision of pregnancy with other poor reproductive or obstetric history, third trimester: Secondary | ICD-10-CM

## 2015-03-23 DIAGNOSIS — Z8632 Personal history of gestational diabetes: Secondary | ICD-10-CM

## 2015-03-23 DIAGNOSIS — O358XX0 Maternal care for other (suspected) fetal abnormality and damage, not applicable or unspecified: Secondary | ICD-10-CM

## 2015-03-26 ENCOUNTER — Ambulatory Visit (HOSPITAL_COMMUNITY): Payer: Medicaid Other

## 2015-05-02 ENCOUNTER — Ambulatory Visit (INDEPENDENT_AMBULATORY_CARE_PROVIDER_SITE_OTHER): Payer: Medicaid Other | Admitting: Family Medicine

## 2015-05-02 ENCOUNTER — Other Ambulatory Visit (HOSPITAL_COMMUNITY)
Admission: RE | Admit: 2015-05-02 | Discharge: 2015-05-02 | Disposition: A | Discharge status: Home / Self Care | Payer: Medicaid Other | Source: Ambulatory Visit | Attending: Family Medicine | Admitting: Family Medicine

## 2015-05-02 VITALS — BP 119/76 | HR 91 | Wt 192.0 lb

## 2015-05-02 DIAGNOSIS — O09291 Supervision of pregnancy with other poor reproductive or obstetric history, first trimester: Secondary | ICD-10-CM

## 2015-05-02 DIAGNOSIS — Z113 Encounter for screening for infections with a predominantly sexual mode of transmission: Secondary | ICD-10-CM | POA: Diagnosis not present

## 2015-05-02 DIAGNOSIS — O34219 Maternal care for unspecified type scar from previous cesarean delivery: Secondary | ICD-10-CM | POA: Diagnosis not present

## 2015-05-02 DIAGNOSIS — O0933 Supervision of pregnancy with insufficient antenatal care, third trimester: Secondary | ICD-10-CM

## 2015-05-02 DIAGNOSIS — Z3481 Encounter for supervision of other normal pregnancy, first trimester: Secondary | ICD-10-CM

## 2015-05-02 DIAGNOSIS — O09293 Supervision of pregnancy with other poor reproductive or obstetric history, third trimester: Secondary | ICD-10-CM | POA: Diagnosis not present

## 2015-05-02 DIAGNOSIS — Z8632 Personal history of gestational diabetes: Secondary | ICD-10-CM

## 2015-05-02 DIAGNOSIS — Z36 Encounter for antenatal screening of mother: Secondary | ICD-10-CM | POA: Diagnosis not present

## 2015-05-02 NOTE — Progress Notes (Signed)
Subjective:  Anna Potter is a 30 y.o. G3P2002 at [redacted]w[redacted]d being seen today for ongoing prenatal care.  She is currently monitored for the following issues for this high-risk pregnancy and has Obesity affecting pregnancy in first trimester; Previous cesarean delivery, delivered; History of gestational diabetes in prior pregnancy, currently pregnant in first trimester; Encounter for supervision of other normal pregnancy in first trimester; Late prenatal care in third trimester; and Fetal echogenic bowel of fetus on her problem list.  Patient reports occasional contractions.  Contractions: Irritability. Vag. Bleeding: None.  Movement: Present. Denies leaking of fluid.  Hasn't come to appts since [redacted]w[redacted]d despite many attempts to contact her.  She states that she didn't have anyone to watch her children.  She did not get the 3hr GTT.    The following portions of the patient's history were reviewed and updated as appropriate: allergies, current medications, past family history, past medical history, past social history, past surgical history and problem list. Problem list updated.  Objective:   Filed Vitals:   05/02/15 1548  BP: 119/76  Pulse: 91  Weight: 192 lb (87.091 kg)    Fetal Status: Fetal Heart Rate (bpm): 138   Movement: Present     General:  Alert, oriented and cooperative. Patient is in no acute distress.  Skin: Skin is warm and dry. No rash noted.   Cardiovascular: Normal heart rate noted  Respiratory: Normal respiratory effort, no problems with respiration noted  Abdomen: Soft, gravid, appropriate for gestational age. Pain/Pressure: Absent     Pelvic: Vag. Bleeding: None Vag D/C Character: Thin   Cervical exam deferred        Extremities: Normal range of motion.  Edema: None  Mental Status: Normal mood and affect. Normal behavior. Normal judgment and thought content.   Urinalysis: Urine Protein: Negative Urine Glucose: Trace  Assessment and Plan:  Pregnancy: G3P2002 at  [redacted]w[redacted]d  1. Encounter for supervision of other normal pregnancy in first trimester FHT and FH normal.  GC/CT and GBS done today.  Wanted BTL, but no 30 day papers.  Information regarding Mirena and nexplanon given.  2. Previous cesarean delivery, delivered Scheduled for cesarean section on Friday 1/25 at 1pm.  3. History of gestational diabetes in prior pregnancy, currently pregnant in first trimester Random blood sugar 1 hr after eating is 137.  Will get NST today just to be certain of fetal well-being - NST reactive  4.  Insufficient prenatal care  Term labor symptoms and general obstetric precautions including but not limited to vaginal bleeding, contractions, leaking of fluid and fetal movement were reviewed in detail with the patient. Please refer to After Visit Summary for other counseling recommendations.  No Follow-up on file.   Levie Heritage, DO

## 2015-05-02 NOTE — Patient Instructions (Signed)
Cesarean Delivery, Care After  Refer to this sheet in the next few weeks. These instructions provide you with information on caring for yourself after your procedure. Your health care provider may also give you specific instructions. Your treatment has been planned according to current medical practices, but problems sometimes occur. Call your health care provider if you have any problems or questions after you go home.  HOME CARE INSTRUCTIONS   Only take over-the-counter or prescription medications as directed by your health care provider.   Do not drink alcohol, especially if you are breastfeeding or taking medication to relieve pain.   Do not chew or smoke tobacco.   Continue to use good perineal care. Good perineal care includes:    Wiping your perineum from front to back.    Keeping your perineum clean.   Check your surgical cut (incision) daily for increased redness, drainage, swelling, or separation of skin.   Clean your incision gently with soap and water every day, and then pat it dry. If your health care provider says it is okay, leave the incision uncovered. Use a bandage (dressing) if the incision is draining fluid or appears irritated. If the adhesive strips across the incision do not fall off within 7 days, carefully peel them off.   Hug a pillow when coughing or sneezing until your incision is healed. This helps to relieve pain.   Do not use tampons or douche until your health care provider says it is okay.   Shower, wash your hair, and take tub baths as directed by your health care provider.   Wear a well-fitting bra that provides breast support.   Limit wearing support panties or control-top hose.   Drink enough fluids to keep your urine clear or pale yellow.   Eat high-fiber foods such as whole grain cereals and breads, brown rice, beans, and fresh fruits and vegetables every day. These foods may help prevent or relieve constipation.   Resume activities such as climbing stairs,  driving, lifting, exercising, or traveling as directed by your health care provider.   Talk to your health care provider about resuming sexual activities. This is dependent upon your risk of infection, your rate of healing, and your comfort and desire to resume sexual activity.   Try to have someone help you with your household activities and your newborn for at least a few days after you leave the hospital.   Rest as much as possible. Try to rest or take a nap when your newborn is sleeping.   Increase your activities gradually.   Keep all of your scheduled postpartum appointments. It is very important to keep your scheduled follow-up appointments. At these appointments, your health care provider will be checking to make sure that you are healing physically and emotionally.  SEEK MEDICAL CARE IF:    You are passing large clots from your vagina. Save any clots to show your health care provider.   You have a foul smelling discharge from your vagina.   You have trouble urinating.   You are urinating frequently.   You have pain when you urinate.   You have a change in your bowel movements.   You have increasing redness, pain, or swelling near your incision.   You have pus draining from your incision.   Your incision is separating.   You have painful, hard, or reddened breasts.   You have a severe headache.   You have blurred vision or see spots.   You feel sad   or depressed.   You have thoughts of hurting yourself or your newborn.   You have questions about your care, the care of your newborn, or medications.   You are dizzy or light-headed.   You have a rash.   You have pain, redness, or swelling at the site of the removed intravenous access (IV) tube.   You have nausea or vomiting.   You stopped breastfeeding and have not had a menstrual period within 12 weeks of stopping.   You are not breastfeeding and have not had a menstrual period within 12 weeks of delivery.   You have a fever.  SEEK  IMMEDIATE MEDICAL CARE IF:   You have persistent pain.   You have chest pain.   You have shortness of breath.   You faint.   You have leg pain.   You have stomach pain.   Your vaginal bleeding saturates 2 or more sanitary pads in 1 hour.  MAKE SURE YOU:    Understand these instructions.   Will watch your condition.   Will get help right away if you are not doing well or get worse.     This information is not intended to replace advice given to you by your health care provider. Make sure you discuss any questions you have with your health care provider.     Document Released: 12/14/2001 Document Revised: 04/14/2014 Document Reviewed: 11/19/2011  Elsevier Interactive Patient Education 2016 [employer]vier Inc.

## 2015-05-02 NOTE — Progress Notes (Signed)
Patient random blood sugar 138. Armandina Stammer RN BSN

## 2015-05-03 ENCOUNTER — Encounter (HOSPITAL_COMMUNITY): Payer: Self-pay

## 2015-05-03 ENCOUNTER — Encounter (HOSPITAL_COMMUNITY)
Admission: RE | Admit: 2015-05-03 | Discharge: 2015-05-03 | Disposition: A | Discharge status: Home / Self Care | Payer: Medicaid Other | Source: Ambulatory Visit | Attending: Obstetrics & Gynecology | Admitting: Obstetrics & Gynecology

## 2015-05-03 HISTORY — DX: Anemia, unspecified: D64.9

## 2015-05-03 HISTORY — DX: Headache, unspecified: R51.9

## 2015-05-03 HISTORY — DX: Headache: R51

## 2015-05-03 HISTORY — DX: Gastro-esophageal reflux disease without esophagitis: K21.9

## 2015-05-03 LAB — BASIC METABOLIC PANEL
ANION GAP: 9 (ref 5–15)
BUN: 9 mg/dL (ref 6–20)
CALCIUM: 8.8 mg/dL — AB (ref 8.9–10.3)
CO2: 22 mmol/L (ref 22–32)
CREATININE: 0.6 mg/dL (ref 0.44–1.00)
Chloride: 107 mmol/L (ref 101–111)
GLUCOSE: 164 mg/dL — AB (ref 65–99)
Potassium: 3.7 mmol/L (ref 3.5–5.1)
Sodium: 138 mmol/L (ref 135–145)

## 2015-05-03 LAB — CBC
HCT: 31.4 % — ABNORMAL LOW (ref 36.0–46.0)
Hemoglobin: 10.2 g/dL — ABNORMAL LOW (ref 12.0–15.0)
MCH: 27.8 pg (ref 26.0–34.0)
MCHC: 32.5 g/dL (ref 30.0–36.0)
MCV: 85.6 fL (ref 78.0–100.0)
PLATELETS: 213 10*3/uL (ref 150–400)
RBC: 3.67 MIL/uL — ABNORMAL LOW (ref 3.87–5.11)
RDW: 15 % (ref 11.5–15.5)
WBC: 11.2 10*3/uL — AB (ref 4.0–10.5)

## 2015-05-03 LAB — RAPID HIV SCREEN (HIV 1/2 AB+AG)
HIV 1/2 ANTIBODIES: NONREACTIVE
HIV-1 P24 Antigen - HIV24: NONREACTIVE

## 2015-05-03 NOTE — Patient Instructions (Signed)
Your procedure is scheduled on:  Friday, Jan. 27, 2017  Enter through the Main Entrance of [facility] at:  11:45 A.M.  Pick up the phone at the desk and dial [phone].  Call this number if you have problems the morning of surgery: [phone].  Remember: Do NOT eat food:  After Midnight Tonight Do NOT drink clear liquids after:  8:00 A.M. Day of surgery Take these medicines the morning of surgery with a SIP OF WATER:  None  Do NOT wear jewelry (body piercing), metal hair clips/bobby pins, or nail polish. Do NOT wear lotions, powders, or perfumes.  You may wear deoderant. Do NOT shave for 48 hours prior to surgery. Do NOT bring valuables to the hospital.  Leave suitcase in car.  After surgery it may be brought to your room.  For patients admitted to the hospital, checkout time is 11:00 AM the day of discharge.

## 2015-05-04 ENCOUNTER — Inpatient Hospital Stay (HOSPITAL_COMMUNITY)
Admission: RE | Admit: 2015-05-04 | Discharge: 2015-05-07 | DRG: 765 | Disposition: A | Discharge status: Home / Self Care | Payer: Medicaid Other | Source: Ambulatory Visit | Attending: Obstetrics & Gynecology | Admitting: Obstetrics & Gynecology

## 2015-05-04 ENCOUNTER — Encounter (HOSPITAL_COMMUNITY): Payer: Self-pay | Admitting: Anesthesiology

## 2015-05-04 ENCOUNTER — Inpatient Hospital Stay (HOSPITAL_COMMUNITY): Payer: Medicaid Other | Admitting: Anesthesiology

## 2015-05-04 ENCOUNTER — Encounter (HOSPITAL_COMMUNITY)
Admission: RE | Disposition: A | Discharge status: Home / Self Care | Payer: Self-pay | Source: Ambulatory Visit | Attending: Obstetrics & Gynecology

## 2015-05-04 DIAGNOSIS — O34211 Maternal care for low transverse scar from previous cesarean delivery: Principal | ICD-10-CM | POA: Diagnosis present

## 2015-05-04 DIAGNOSIS — Z3A39 39 weeks gestation of pregnancy: Secondary | ICD-10-CM | POA: Diagnosis not present

## 2015-05-04 DIAGNOSIS — O2492 Unspecified diabetes mellitus in childbirth: Secondary | ICD-10-CM | POA: Diagnosis present

## 2015-05-04 DIAGNOSIS — Z8249 Family history of ischemic heart disease and other diseases of the circulatory system: Secondary | ICD-10-CM

## 2015-05-04 DIAGNOSIS — Z87891 Personal history of nicotine dependence: Secondary | ICD-10-CM | POA: Diagnosis not present

## 2015-05-04 DIAGNOSIS — Z833 Family history of diabetes mellitus: Secondary | ICD-10-CM

## 2015-05-04 DIAGNOSIS — Z98891 History of uterine scar from previous surgery: Secondary | ICD-10-CM

## 2015-05-04 LAB — TYPE AND SCREEN
ABO/RH(D): O POS
ABO/RH(D): O POS
ANTIBODY SCREEN: NEGATIVE
Antibody Screen: NEGATIVE

## 2015-05-04 LAB — RPR: RPR: NONREACTIVE

## 2015-05-04 LAB — GC/CHLAMYDIA PROBE AMP (~~LOC~~) NOT AT ARMC
CHLAMYDIA, DNA PROBE: NEGATIVE
NEISSERIA GONORRHEA: NEGATIVE

## 2015-05-04 LAB — CULTURE, BETA STREP (GROUP B ONLY)

## 2015-05-04 LAB — ABO/RH: ABO/RH(D): O POS

## 2015-05-04 SURGERY — Surgical Case
Anesthesia: Spinal

## 2015-05-04 MED ORDER — KETOROLAC TROMETHAMINE 30 MG/ML IJ SOLN: 30.0000 mg | Freq: Four times a day (QID) | INTRAMUSCULAR | Status: DC | PRN

## 2015-05-04 MED ORDER — LACTATED RINGERS IV SOLN
INTRAVENOUS | Status: DC
  Administered 2015-05-04: 23:00:00 via INTRAVENOUS

## 2015-05-04 MED ORDER — LANOLIN HYDROUS EX OINT: 1.0000 "application " | TOPICAL_OINTMENT | CUTANEOUS | Status: DC | PRN

## 2015-05-04 MED ORDER — SCOPOLAMINE 1 MG/3DAYS TD PT72: 1.0000 | MEDICATED_PATCH | Freq: Once | TRANSDERMAL | Status: DC

## 2015-05-04 MED ORDER — NALOXONE HCL 2 MG/2ML IJ SOSY
1.0000 ug/kg/h | PREFILLED_SYRINGE | INTRAMUSCULAR | Status: DC | PRN
  Filled 2015-05-04: qty 2

## 2015-05-04 MED ORDER — PRENATAL MULTIVITAMIN CH
1.0000 | ORAL_TABLET | Freq: Every day | ORAL | Status: DC
  Administered 2015-05-05 – 2015-05-06 (×2): 1 via ORAL
  Filled 2015-05-04 (×2): qty 1

## 2015-05-04 MED ORDER — KETOROLAC TROMETHAMINE 30 MG/ML IJ SOLN
INTRAMUSCULAR | Status: AC
  Filled 2015-05-04: qty 1

## 2015-05-04 MED ORDER — WITCH HAZEL-GLYCERIN EX PADS: 1.0000 "application " | MEDICATED_PAD | CUTANEOUS | Status: DC | PRN

## 2015-05-04 MED ORDER — NALBUPHINE HCL 10 MG/ML IJ SOLN
5.0000 mg | INTRAMUSCULAR | Status: DC | PRN
  Administered 2015-05-05 (×2): 5 mg via SUBCUTANEOUS
  Filled 2015-05-04 (×3): qty 1

## 2015-05-04 MED ORDER — ONDANSETRON HCL 4 MG/2ML IJ SOLN
INTRAMUSCULAR | Status: DC | PRN
  Administered 2015-05-04: 4 mg via INTRAVENOUS

## 2015-05-04 MED ORDER — NALOXONE HCL 0.4 MG/ML IJ SOLN: 0.4000 mg | INTRAMUSCULAR | Status: DC | PRN

## 2015-05-04 MED ORDER — BUPIVACAINE IN DEXTROSE 0.75-8.25 % IT SOLN
INTRATHECAL | Status: DC | PRN
  Administered 2015-05-04: 1.6 mg via INTRATHECAL

## 2015-05-04 MED ORDER — IBUPROFEN 600 MG PO TABS
600.0000 mg | ORAL_TABLET | Freq: Four times a day (QID) | ORAL | Status: DC | PRN
  Administered 2015-05-06: 600 mg via ORAL

## 2015-05-04 MED ORDER — LACTATED RINGERS IV SOLN
40.0000 [IU] | INTRAVENOUS | Status: DC | PRN
  Administered 2015-05-04: 40 [IU] via INTRAVENOUS

## 2015-05-04 MED ORDER — TETANUS-DIPHTH-ACELL PERTUSSIS 5-2.5-18.5 LF-MCG/0.5 IM SUSP: 0.5000 mL | Freq: Once | INTRAMUSCULAR | Status: DC

## 2015-05-04 MED ORDER — SIMETHICONE 80 MG PO CHEW: 80.0000 mg | CHEWABLE_TABLET | ORAL | Status: DC | PRN

## 2015-05-04 MED ORDER — MEPERIDINE HCL 25 MG/ML IJ SOLN
6.2500 mg | INTRAMUSCULAR | Status: DC | PRN
Start: 2015-05-04 — End: 2015-05-04

## 2015-05-04 MED ORDER — SIMETHICONE 80 MG PO CHEW
80.0000 mg | CHEWABLE_TABLET | ORAL | Status: DC
  Administered 2015-05-04 – 2015-05-06 (×3): 80 mg via ORAL
  Filled 2015-05-04 (×3): qty 1

## 2015-05-04 MED ORDER — LACTATED RINGERS IV SOLN
125.0000 mL/h | INTRAVENOUS | Status: DC
  Administered 2015-05-04: 14:00:00 via INTRAVENOUS
  Administered 2015-05-04: 125 mL/h via INTRAVENOUS
  Administered 2015-05-04: 13:00:00 via INTRAVENOUS

## 2015-05-04 MED ORDER — SENNOSIDES-DOCUSATE SODIUM 8.6-50 MG PO TABS
2.0000 | ORAL_TABLET | ORAL | Status: DC
  Administered 2015-05-04 – 2015-05-06 (×3): 2 via ORAL
  Filled 2015-05-04 (×3): qty 2

## 2015-05-04 MED ORDER — HYDROMORPHONE HCL 1 MG/ML IJ SOLN: 0.2500 mg | INTRAMUSCULAR | Status: DC | PRN

## 2015-05-04 MED ORDER — FENTANYL CITRATE (PF) 100 MCG/2ML IJ SOLN
INTRAMUSCULAR | Status: DC | PRN
Start: 2015-05-04 — End: 2015-05-04
  Administered 2015-05-04: 10 ug via INTRATHECAL

## 2015-05-04 MED ORDER — MORPHINE SULFATE (PF) 0.5 MG/ML IJ SOLN
INTRAMUSCULAR | Status: DC | PRN
  Administered 2015-05-04: .2 mg via INTRATHECAL

## 2015-05-04 MED ORDER — ONDANSETRON HCL 4 MG/2ML IJ SOLN
INTRAMUSCULAR | Status: AC
  Filled 2015-05-04: qty 2

## 2015-05-04 MED ORDER — CEFAZOLIN SODIUM-DEXTROSE 2-3 GM-% IV SOLR
INTRAVENOUS | Status: AC
  Filled 2015-05-04: qty 50

## 2015-05-04 MED ORDER — MORPHINE SULFATE (PF) 0.5 MG/ML IJ SOLN
INTRAMUSCULAR | Status: AC
  Filled 2015-05-04: qty 10

## 2015-05-04 MED ORDER — FENTANYL CITRATE (PF) 100 MCG/2ML IJ SOLN
INTRAMUSCULAR | Status: AC
  Filled 2015-05-04: qty 2

## 2015-05-04 MED ORDER — IBUPROFEN 600 MG PO TABS
600.0000 mg | ORAL_TABLET | Freq: Four times a day (QID) | ORAL | Status: DC
Start: 2015-05-05 — End: 2015-05-07
  Administered 2015-05-04 – 2015-05-07 (×9): 600 mg via ORAL
  Filled 2015-05-04 (×10): qty 1

## 2015-05-04 MED ORDER — CEFAZOLIN SODIUM-DEXTROSE 2-3 GM-% IV SOLR
2.0000 g | INTRAVENOUS | Status: AC
  Administered 2015-05-04: 2 g via INTRAVENOUS

## 2015-05-04 MED ORDER — NALBUPHINE HCL 10 MG/ML IJ SOLN
5.0000 mg | Freq: Once | INTRAMUSCULAR | Status: AC | PRN
  Administered 2015-05-04: 5 mg via SUBCUTANEOUS

## 2015-05-04 MED ORDER — MENTHOL 3 MG MT LOZG: 1.0000 | LOZENGE | OROMUCOSAL | Status: DC | PRN

## 2015-05-04 MED ORDER — DIPHENHYDRAMINE HCL 25 MG PO CAPS: 25.0000 mg | ORAL_CAPSULE | Freq: Four times a day (QID) | ORAL | Status: DC | PRN

## 2015-05-04 MED ORDER — OXYCODONE-ACETAMINOPHEN 5-325 MG PO TABS
1.0000 | ORAL_TABLET | ORAL | Status: DC | PRN
  Administered 2015-05-05 – 2015-05-07 (×6): 1 via ORAL
  Filled 2015-05-04 (×6): qty 1

## 2015-05-04 MED ORDER — SIMETHICONE 80 MG PO CHEW
80.0000 mg | CHEWABLE_TABLET | Freq: Three times a day (TID) | ORAL | Status: DC
  Administered 2015-05-05 – 2015-05-06 (×6): 80 mg via ORAL
  Filled 2015-05-04 (×7): qty 1

## 2015-05-04 MED ORDER — ONDANSETRON HCL 4 MG/2ML IJ SOLN: 4.0000 mg | Freq: Three times a day (TID) | INTRAMUSCULAR | Status: DC | PRN

## 2015-05-04 MED ORDER — PHENYLEPHRINE 8 MG IN D5W 100 ML (0.08MG/ML) PREMIX OPTIME
INJECTION | INTRAVENOUS | Status: DC | PRN
  Administered 2015-05-04: 60 ug/min via INTRAVENOUS

## 2015-05-04 MED ORDER — NALBUPHINE HCL 10 MG/ML IJ SOLN
INTRAMUSCULAR | Status: AC
  Administered 2015-05-05: 5 mg via SUBCUTANEOUS
  Filled 2015-05-04: qty 1

## 2015-05-04 MED ORDER — ACETAMINOPHEN 325 MG PO TABS: 650.0000 mg | ORAL_TABLET | ORAL | Status: DC | PRN

## 2015-05-04 MED ORDER — SCOPOLAMINE 1 MG/3DAYS TD PT72
MEDICATED_PATCH | TRANSDERMAL | Status: AC
  Administered 2015-05-04: 1.5 mg
  Filled 2015-05-04: qty 1

## 2015-05-04 MED ORDER — KETOROLAC TROMETHAMINE 30 MG/ML IJ SOLN: 30.0000 mg | Freq: Once | INTRAMUSCULAR | Status: DC

## 2015-05-04 MED ORDER — KETOROLAC TROMETHAMINE 30 MG/ML IJ SOLN
30.0000 mg | Freq: Four times a day (QID) | INTRAMUSCULAR | Status: DC | PRN
  Administered 2015-05-04: 30 mg via INTRAMUSCULAR

## 2015-05-04 MED ORDER — NALBUPHINE HCL 10 MG/ML IJ SOLN
5.0000 mg | INTRAMUSCULAR | Status: DC | PRN
  Administered 2015-05-04: 5 mg via INTRAVENOUS
  Filled 2015-05-04 (×2): qty 1

## 2015-05-04 MED ORDER — BUPIVACAINE HCL (PF) 0.5 % IJ SOLN
INTRAMUSCULAR | Status: AC
  Filled 2015-05-04: qty 30

## 2015-05-04 MED ORDER — PROMETHAZINE HCL 25 MG/ML IJ SOLN: 6.2500 mg | INTRAMUSCULAR | Status: DC | PRN

## 2015-05-04 MED ORDER — DIBUCAINE 1 % RE OINT: 1.0000 "application " | TOPICAL_OINTMENT | RECTAL | Status: DC | PRN

## 2015-05-04 MED ORDER — MEPERIDINE HCL 25 MG/ML IJ SOLN: 6.2500 mg | INTRAMUSCULAR | Status: DC | PRN

## 2015-05-04 MED ORDER — ZOLPIDEM TARTRATE 5 MG PO TABS: 5.0000 mg | ORAL_TABLET | Freq: Every evening | ORAL | Status: DC | PRN

## 2015-05-04 MED ORDER — DIPHENHYDRAMINE HCL 50 MG/ML IJ SOLN: 12.5000 mg | INTRAMUSCULAR | Status: DC | PRN

## 2015-05-04 MED ORDER — OXYCODONE-ACETAMINOPHEN 5-325 MG PO TABS: 2.0000 | ORAL_TABLET | ORAL | Status: DC | PRN

## 2015-05-04 MED ORDER — PHENYLEPHRINE 8 MG IN D5W 100 ML (0.08MG/ML) PREMIX OPTIME
INJECTION | INTRAVENOUS | Status: AC
  Filled 2015-05-04: qty 100

## 2015-05-04 MED ORDER — BUPIVACAINE HCL (PF) 0.5 % IJ SOLN
INTRAMUSCULAR | Status: DC | PRN
  Administered 2015-05-04: 30 mL

## 2015-05-04 MED ORDER — OXYTOCIN 10 UNIT/ML IJ SOLN
INTRAMUSCULAR | Status: AC
  Filled 2015-05-04: qty 4

## 2015-05-04 MED ORDER — ACETAMINOPHEN 500 MG PO TABS
1000.0000 mg | ORAL_TABLET | Freq: Four times a day (QID) | ORAL | Status: AC
  Administered 2015-05-04 – 2015-05-05 (×2): 1000 mg via ORAL
  Filled 2015-05-04 (×2): qty 2

## 2015-05-04 MED ORDER — SODIUM CHLORIDE 0.9% FLUSH
3.0000 mL | INTRAVENOUS | Status: DC | PRN
Start: 2015-05-04 — End: 2015-05-07

## 2015-05-04 MED ORDER — OXYTOCIN 10 UNIT/ML IJ SOLN: 2.5000 [IU]/h | INTRAVENOUS | Status: AC

## 2015-05-04 MED ORDER — NALBUPHINE HCL 10 MG/ML IJ SOLN: 5.0000 mg | Freq: Once | INTRAMUSCULAR | Status: AC | PRN

## 2015-05-04 MED ORDER — DIPHENHYDRAMINE HCL 25 MG PO CAPS
25.0000 mg | ORAL_CAPSULE | ORAL | Status: DC | PRN
  Filled 2015-05-04: qty 1

## 2015-05-04 SURGICAL SUPPLY — 37 items
BARRIER ADHS 3X4 INTERCEED (GAUZE/BANDAGES/DRESSINGS) IMPLANT
BENZOIN TINCTURE PRP APPL 2/3 (GAUZE/BANDAGES/DRESSINGS) ×3 IMPLANT
CLAMP CORD UMBIL (MISCELLANEOUS) IMPLANT
CLOSURE WOUND 1/2 X4 (GAUZE/BANDAGES/DRESSINGS) ×1
CLOTH BEACON ORANGE TIMEOUT ST (SAFETY) ×3 IMPLANT
DRAPE SHEET LG 3/4 BI-LAMINATE (DRAPES) IMPLANT
DRSG OPSITE POSTOP 4X10 (GAUZE/BANDAGES/DRESSINGS) ×3 IMPLANT
DURAPREP 26ML APPLICATOR (WOUND CARE) ×3 IMPLANT
ELECT REM PT RETURN 9FT ADLT (ELECTROSURGICAL) ×3
ELECTRODE REM PT RTRN 9FT ADLT (ELECTROSURGICAL) ×1 IMPLANT
EXTRACTOR VACUUM KIWI (MISCELLANEOUS) IMPLANT
GLOVE BIO SURGEON STRL SZ 6.5 (GLOVE) ×2 IMPLANT
GLOVE BIO SURGEONS STRL SZ 6.5 (GLOVE) ×1
GLOVE BIOGEL PI IND STRL 7.0 (GLOVE) ×2 IMPLANT
GLOVE BIOGEL PI INDICATOR 7.0 (GLOVE) ×4
GOWN STRL REUS W/TWL LRG LVL3 (GOWN DISPOSABLE) ×6 IMPLANT
KIT ABG SYR 3ML LUER SLIP (SYRINGE) IMPLANT
NEEDLE HYPO 22GX1.5 SAFETY (NEEDLE) IMPLANT
NEEDLE HYPO 25X5/8 SAFETYGLIDE (NEEDLE) IMPLANT
NS IRRIG 1000ML POUR BTL (IV SOLUTION) ×3 IMPLANT
PACK C SECTION WH (CUSTOM PROCEDURE TRAY) ×3 IMPLANT
PAD ABD 7.5X8 STRL (GAUZE/BANDAGES/DRESSINGS) ×3 IMPLANT
PAD ABD 8X7 1/2 STERILE (GAUZE/BANDAGES/DRESSINGS) ×3 IMPLANT
PAD OB MATERNITY 4.3X12.25 (PERSONAL CARE ITEMS) ×3 IMPLANT
PENCIL SMOKE EVAC W/HOLSTER (ELECTROSURGICAL) ×3 IMPLANT
RETRACTOR WND ALEXIS 25 LRG (MISCELLANEOUS) IMPLANT
RTRCTR WOUND ALEXIS 25CM LRG (MISCELLANEOUS)
SPONGE GAUZE 4X4 12PLY STER LF (GAUZE/BANDAGES/DRESSINGS) ×6 IMPLANT
STRIP CLOSURE SKIN 1/2X4 (GAUZE/BANDAGES/DRESSINGS) ×2 IMPLANT
SUT VIC AB 0 CT1 36 (SUTURE) ×18 IMPLANT
SUT VIC AB 2-0 CT1 27 (SUTURE) ×2
SUT VIC AB 2-0 CT1 TAPERPNT 27 (SUTURE) ×1 IMPLANT
SUT VIC AB 4-0 KS 27 (SUTURE) ×3 IMPLANT
SUT VIC AB 4-0 PS2 27 (SUTURE) ×3 IMPLANT
SYR CONTROL 10ML LL (SYRINGE) IMPLANT
TOWEL OR 17X24 6PK STRL BLUE (TOWEL DISPOSABLE) ×3 IMPLANT
TRAY FOLEY CATH SILVER 14FR (SET/KITS/TRAYS/PACK) IMPLANT

## 2015-05-04 NOTE — Lactation Note (Signed)
This note was copied from the chart of Anna Azaela Caracci. Lactation Consultation Note Initial visit at 9 hours of age.  Mom reports several good feedings and denies concerns.  Mom is unaware of hand expression so LC assisted with several drops easily expressed.  Lc encouraged mom to hand express prior to latch and after to rub onto nipples as needed.  [facility] LC resources given and discussed.  Encouraged to feed with early cues on demand.  Early newborn behavior discussed. Mom to call for assist as needed.    Patient Name: Anna Potter [id number] Date: 05/04/2015 Reason for consult: Initial assessment   Maternal Data Has patient been taught Hand Expression?: Yes Does the patient have breastfeeding experience prior to this delivery?: Yes  Feeding Feeding Type: Breast Fed  LATCH Score/Interventions Latch: Grasps breast easily, tongue down, lips flanged, rhythmical sucking.  Audible Swallowing: Spontaneous and intermittent Intervention(s): Skin to skin;Hand expression  Type of Nipple: Everted at rest and after stimulation  Comfort (Breast/Nipple): Soft / non-tender     Hold (Positioning): Assistance needed to correctly position infant at breast and maintain latch.  LATCH Score: 9  Lactation Tools Discussed/Used WIC Program: Yes   Consult Status Consult Status: Follow-up Date: 05/05/15 Follow-up type: In-patient    Jannifer Rodney 05/04/2015, 11:04 PM

## 2015-05-04 NOTE — Brief Op Note (Signed)
05/04/2015  2:20 PM  PATIENT:  Anna Potter  30 y.o. female  PRE-OPERATIVE DIAGNOSIS:  repeat cesarean section  POST-OPERATIVE DIAGNOSIS:  repeat cesarean section  PROCEDURE:  Procedure(s): CESAREAN SECTION (N/A)  SURGEON:  Surgeon(s) and Role:    * Adam Phenix, MD - Primary    * Federico Flake, MD - Assisting  PHYSICIAN ASSISTANT:   ASSISTANTS: none   ANESTHESIA:   local and spinal  EBL:  Total I/O In: 2400 [I.V.:2400] Out: 800 [Urine:100; Blood:700]  BLOOD ADMINISTERED:none  DRAINS: none   LOCAL MEDICATIONS USED:  MARCAINE     SPECIMEN:  Source of Specimen:  Placenta  DISPOSITION OF SPECIMEN:  Labor and Delivery  COUNTS:  YES  TOURNIQUET:  * No tourniquets in log *  DICTATION: .Note written in EPIC  PLAN OF CARE: Admit to inpatient   PATIENT DISPOSITION:  PACU - hemodynamically stable.   Delay start of Pharmacological VTE agent (>24hrs) due to surgical blood loss or risk of bleeding: yes

## 2015-05-04 NOTE — Anesthesia Procedure Notes (Signed)
Spinal Patient location during procedure: OR Start time: 05/04/2015 1:17 PM End time: 05/04/2015 1:20 PM Staffing Anesthesiologist: Leilani Able Performed by: anesthesiologist  Preanesthetic Checklist Completed: patient identified, surgical consent, pre-op evaluation, timeout performed, IV checked, risks and benefits discussed and monitors and equipment checked Spinal Block Patient position: sitting Prep: DuraPrep Patient monitoring: heart rate, cardiac monitor, continuous pulse ox and blood pressure Approach: midline Location: L3-4 Injection technique: single-shot Needle Needle type: Sprotte  Needle gauge: 24 G Needle length: 9 cm Needle insertion depth: 6 cm Assessment Sensory level: T4

## 2015-05-04 NOTE — Anesthesia Preprocedure Evaluation (Signed)
Anesthesia Evaluation  Patient identified by MRN, date of birth, ID band Patient awake    Reviewed: Allergy & Precautions, H&P , NPO status , Patient's Chart, lab work & pertinent test results  Airway Mallampati: I  TM Distance: >3 FB Neck ROM: full    Dental no notable dental hx.    Pulmonary former smoker,    Pulmonary exam normal breath sounds clear to auscultation       Cardiovascular negative cardio ROS Normal cardiovascular exam     Neuro/Psych negative psych ROS   GI/Hepatic negative GI ROS, Neg liver ROS,   Endo/Other  diabetes  Renal/GU negative Renal ROS     Musculoskeletal   Abdominal Normal abdominal exam  (+)   Peds  Hematology negative hematology ROS (+)   Anesthesia Other Findings   Reproductive/Obstetrics (+) Pregnancy                             Anesthesia Physical Anesthesia Plan  ASA: II  Anesthesia Plan: Spinal   Post-op Pain Management:    Induction:   Airway Management Planned:   Additional Equipment:   Intra-op Plan:   Post-operative Plan:   Informed Consent: I have reviewed the patients History and Physical, chart, labs and discussed the procedure including the risks, benefits and alternatives for the proposed anesthesia with the patient or authorized representative who has indicated his/her understanding and acceptance.     Plan Discussed with: Surgeon and CRNA  Anesthesia Plan Comments:         Anesthesia Quick Evaluation

## 2015-05-04 NOTE — Transfer of Care (Signed)
Immediate Anesthesia Transfer of Care Note  Patient: Anna Potter  Procedure(s) Performed: Procedure(s): CESAREAN SECTION (N/A)  Patient Location: PACU  Anesthesia Type:Spinal  Level of Consciousness: awake and alert   Airway & Oxygen Therapy: Patient Spontanous Breathing  Post-op Assessment: Report given to RN and Post -op Vital signs reviewed and stable  Post vital signs: Reviewed and stable  Last Vitals:  Filed Vitals:   05/04/15 1148  BP: 127/88  Pulse: 98  Temp: 36.5 C  Resp: 20    Complications: No apparent anesthesia complications

## 2015-05-04 NOTE — H&P (Signed)
Obstetric Preoperative History and Physical  Anna Potter is a 30 y.o. G8B1694 with IUP at 22w0dpresenting for presenting for scheduled cesarean section.  No acute concerns.   [facility] lapse for 10 weeks at the end of pregnancy Pregnancy complications or risks: Patient Active Problem List   Diagnosis Date Noted  . Fetal echogenic bowel of fetus 01/25/2015  . Obesity affecting pregnancy in first trimester 01/15/2015  . Previous cesarean delivery, delivered 01/15/2015  . History of gestational diabetes in prior pregnancy, currently pregnant in first trimester 01/15/2015  . Encounter for supervision of other normal pregnancy in first trimester 01/15/2015  . Late prenatal care in third trimester 01/15/2015   She plans to breastfeed She desires IUD, or Nexplanon for postpartum contraception.   Prenatal labs and studies: ABO, Rh: --/--/O POS, O POS (01/26 1440) Antibody: NEG (01/26 1440) Rubella: 1.42 (10/10 0001) RPR: Non Reactive (01/26 1440)  HBsAg: NEGATIVE (10/10 0001)  HIV: NONREACTIVE (11/16 1457)  GBS:  1 hr Glucola  182. No 3hr - patient could not be reached Genetic screening too late Anatomy [place]  Prenatal Transfer Tool  Maternal Diabetes: No- but mother did not receive definitive testing. 1hr was 182. She might have GDM Genetic Screening: Declined- too late Maternal Ultrasounds/Referrals: Abnormal:  Findings:   Echogenic bowel Fetal Ultrasounds or other Referrals:  None Maternal Substance Abuse:  No Significant Maternal Medications:  None Significant Maternal Lab Results: Lab values include: Group B Strep negative  Past Medical History  Diagnosis Date  . Diabetes mellitus without complication (HCC)     gestational  . GERD (gastroesophageal reflux disease)     with pregnancy  . Headache   . Anemia     Past Surgical History  Procedure Laterality Date  . Cesarean section      OB History  Gravida Para Term  Preterm AB SAB TAB Ectopic Multiple Living  _0 # Outcome Date GA Lbr Len/2nd Weight Sex Delivery Anes PTL Lv  3 Current           2 Term 03/06/13    MTressie Stalker 1 Term 05/02/11    MBerenice Bouton  Y      Social History   Social History  . Marital Status: Single    Spouse Name: N/A  . Number of Children: N/A  . Years of Education: N/A   Social History Main Topics  . Smoking status: Former Smoker -- .3 years    Types: E-cigarettes    Quit date: 09/06/2014  . Smokeless tobacco: Never Used  . Alcohol Use: No  . Drug Use: No  . Sexual Activity: Yes    Birth Control/ Protection: None   Other Topics Concern  . Not on file   Social History Narrative    Family History  Problem Relation Age of Onset  . Hypertension Mother   . Diabetes Father     Prescriptions prior to admission  Medication Sig Dispense Refill Last Dose  . Prenatal Vit-Fe Fumarate-FA (PRENATAL MULTIVITAMIN) TABS tablet Take 1 tablet by mouth daily at 12 noon. 90 tablet 6 05/03/2015 at Unknown time  . triamcinolone cream (KENALOG) 0.1 % Apply 1 application topically 2 (two) times daily. 30 g 3 Past Month at Unknown time  . Iron-FA-B Cmp-C-Biot-Probiotic (FUSION PLUS) CAPS Take 1 capsule by mouth daily. (Patient not taking: Reported on 05/04/2015) 30  capsule 12 Not Taking    No Known Allergies  Review of Systems: Negative except for what is mentioned in HPI.  Physical Exam: BP 127/88 mmHg  Pulse 98  Temp(Src) 97.7 F (36.5 C) (Oral)  Resp 20  SpO2 100%  LMP 08/04/2014 (LMP Unknown) FHR by Doppler: 136 bpm CONSTITUTIONAL: Well-developed, well-nourished female in no acute distress.  HENT:  Normocephalic, atraumatic, External right and left ear normal. Oropharynx is clear and moist EYES: Conjunctivae and EOM are normal. Pupils are equal, round, and reactive to light. No scleral icterus.  NECK: Normal range of motion, supple, no masses SKIN: Skin is warm and dry. No rash noted. Not  diaphoretic. No erythema. No pallor. [place]: Alert and oriented to person, place, and time. Normal reflexes, muscle tone coordination. No cranial nerve deficit noted. PSYCHIATRIC: Normal mood and affect. Normal behavior. Normal judgment and thought content. CARDIOVASCULAR: Normal heart rate noted, regular rhythm RESPIRATORY: Effort and breath sounds normal, no problems with respiration noted ABDOMEN: Soft, nontender, nondistended, gravid. Well-healed Pfannenstiel incisionx2, aligned . PELVIC: Deferred MUSCULOSKELETAL: Normal range of motion. No edema and no tenderness. 2+ distal pulses.   Pertinent Labs/Studies:   Results for orders placed or performed during the hospital encounter of 05/03/15 (from the past 72 hour(s))  CBC     Status: Abnormal   Collection Time: 05/03/15  2:40 PM  Result Value Ref Range   WBC 11.2 (H) 4.0 - 10.5 K/uL   RBC 3.67 (L) 3.87 - 5.11 MIL/uL   Hemoglobin 10.2 (L) 12.0 - 15.0 g/dL   HCT 31.4 (L) 36.0 - 46.0 %   MCV 85.6 78.0 - 100.0 fL   MCH 27.8 26.0 - 34.0 pg   MCHC 32.5 30.0 - 36.0 g/dL   RDW 15.0 11.5 - 15.5 %   Platelets 213 150 - 400 K/uL  Rapid HIV screen (HIV 1/2 Ab+Ag)     Status: None   Collection Time: 05/03/15  2:40 PM  Result Value Ref Range   HIV-1 P24 Antigen - HIV24 NON REACTIVE NON REACTIVE   HIV 1/2 Antibodies NON REACTIVE NON REACTIVE   Interpretation (HIV Ag Ab)      A non reactive test result means that HIV 1 or HIV 2 antibodies and HIV 1 p24 antigen were not detected in the specimen.  RPR     Status: None   Collection Time: 05/03/15  2:40 PM  Result Value Ref Range   RPR Ser Ql Non Reactive Non Reactive    Comment: (NOTE) Performed At: [facility] [place], [place] [id number] Lindon Romp MD JM:[id number]   Type and screen Type and Screen on C/S only     Status: None   Collection Time: 05/03/15  2:40 PM  Result Value Ref Range   ABO/RH(D) O POS    Antibody Screen NEG    Sample Expiration  22/97/9892   Basic metabolic panel     Status: Abnormal   Collection Time: 05/03/15  2:40 PM  Result Value Ref Range   Sodium 138 135 - 145 mmol/L   Potassium 3.7 3.5 - 5.1 mmol/L   Chloride 107 101 - 111 mmol/L   CO2 22 22 - 32 mmol/L   Glucose, Bld 164 (H) 65 - 99 mg/dL   BUN 9 6 - 20 mg/dL   Creatinine, Ser 0.60 0.44 - 1.00 mg/dL   Calcium 8.8 (L) 8.9 - 10.3 mg/dL   GFR calc non Af Amer >60 >60 mL/min   GFR calc  Af Amer >60 >60 mL/min    Comment: (NOTE) The eGFR has been calculated using the CKD EPI equation. This calculation has not been validated in all clinical situations. eGFR's persistently <60 mL/min signify possible Chronic Kidney Disease.    Anion gap 9 5 - 15  ABO/Rh     Status: None   Collection Time: 05/03/15  2:40 PM  Result Value Ref Range   ABO/RH(D) O POS     Assessment and Plan :Jeri Jeanbaptiste is a 30 y.o. G3P2002 at 79w0dbeing admitted being admitted for scheduled cesarean section. The risks of cesarean section discussed with the patient included but were not limited to: bleeding which may require transfusion or reoperation; infection which may require antibiotics; injury to bowel, bladder, ureters or other surrounding organs; injury to the fetus; need for additional procedures including hysterectomy in the event of a life-threatening hemorrhage; placental abnormalities wth subsequent pregnancies, incisional problems, thromboembolic phenomenon and other postoperative/anesthesia complications. The patient concurred with the proposed plan, giving informed written consent for the procedure. Patient has been NPO since last night she will remain NPO for procedure. Anesthesia and OR aware. Preoperative prophylactic antibiotics and SCDs ordered on call to the OR. To OR when ready.    KCaren Macadam MD , MPH, ABFM Family Medicine, [facility] Fellow [facility]

## 2015-05-04 NOTE — Op Note (Signed)
05/04/2015    2:20 PM  PATIENT:  Anna Potter  30 y.o. female  PRE-OPERATIVE DIAGNOSIS:  repeat cesarean section  POST-OPERATIVE DIAGNOSIS:  repeat cesarean section  PROCEDURE:  Procedure(s): CESAREAN SECTION (N/A)  SURGEON:  Surgeon(s) and Role:    * Adam Phenix, MD - Primary    * Federico Flake, MD - Assisting  PHYSICIAN ASSISTANT: none  ASSISTANTS: none   ANESTHESIA:   local and spinal  EBL:  Total I/O In: 2400 [I.V.:2400] Out: 800 [Urine:100; Blood:700]  INDICATIONS: Anna Potter is a 30 y.o. G3P3003 at [redacted]w[redacted]d here for cesarean section secondary to the indications listed under preoperative diagnoses; please see preoperative note for further details.  The risks of cesarean section were discussed with the patient including but were not limited to: bleeding which may require transfusion or reoperation; infection which may require antibiotics; injury to bowel, bladder, ureters or other surrounding organs; injury to the fetus; need for additional procedures including hysterectomy in the event of a life-threatening hemorrhage; placental abnormalities wth subsequent pregnancies, incisional problems, thromboembolic phenomenon and other postoperative/anesthesia complications.   The patient concurred with the proposed plan, giving informed written consent for the procedure.    FINDINGS:  Viable female infant in cephalic presentation.  Apgars 9 and 9.  Clear amniotic fluid.  Intact placenta, three vessel cord.  Normal uterus, fallopian tubes and ovaries bilaterally. Rectus muscle adhesions to anterior abdomina wall.    PROCEDURE IN DETAIL:  The patient preoperatively received intravenous antibiotics and had sequential compression devices applied to her lower extremities.  She was then taken to the operating room where spinal anesthesia was administered and dosed up to surgical level and was found to be adequate. She was then placed in a dorsal supine position with a leftward  tilt, and prepped and draped in a sterile manner.  A foley catheter was placed into her bladder and attached to constant gravity.  After an adequate timeout was performed, a Pfannenstiel skin incision was made with scalpel and carried through to the underlying layer of fascia. The fascia was incised in the midline, and this incision was extended bilaterally using the Mayo scissors.  Kocher clamps were applied to the superior aspect of the fascial incision and the underlying rectus muscles were dissected off bluntly. A similar process was carried out on the inferior aspect of the fascial incision. The rectus muscles were separated in the midline bluntly and the peritoneum was entered bluntly. Attention was turned to the lower uterine segment where a low transverse hysterotomy was made with a scalpel and extended bilaterally bluntly.  The infant was successfully delivered, the cord was clamped and cut and the infant was handed over to awaiting neonatology team. Uterine massage was then administered, and the placenta delivered intact with a three-vessel cord. The uterus was then cleared of clot and debris.  The hysterotomy was closed with 0 Vicryl in a running locked fashion, and an imbricating layer was also placed with 0 Vicryl.  The pelvis was cleared of all clot and debris. Hemostasis was confirmed on all surfaces.  The peritoneum and the muscles were reapproximated using 0 Vicryl interrupted stitches. The fascia was then closed using 0 Vicryl using a running fashion.  30 ml of 0.5% Marcaine was injected subcutaneously around the incision.  The skin was closed with a 4-0 Vicryl subcuticular stitch. The patient tolerated the procedure well. Sponge, lap, instrument and needle counts were correct x 2.  She was taken to the recovery room in  stable condition.   Federico Flake, MD, MPH, ABFM Family Medicine, OB Fellow [facility]

## 2015-05-04 NOTE — Anesthesia Postprocedure Evaluation (Signed)
Anesthesia Post Note  Patient: Anna Potter  Procedure(s) Performed: Procedure(s) (LRB): CESAREAN SECTION (N/A)  Patient location during evaluation: PACU Anesthesia Type: Spinal Level of consciousness: awake Pain management: pain level controlled Vital Signs Assessment: post-procedure vital signs reviewed and stable Respiratory status: spontaneous breathing Cardiovascular status: stable Postop Assessment: no headache, no backache, spinal receding, patient able to bend at knees and no signs of nausea or vomiting Anesthetic complications: no    Last Vitals:  Filed Vitals:   05/04/15 1526 05/04/15 1527  BP:    Pulse: 79 77  Temp:    Resp: 15 16    Last Pain: There were no vitals filed for this visit.               Amandalee Lacap JR,JOHN Susann Givens

## 2015-05-05 LAB — CBC
HCT: 29 % — ABNORMAL LOW (ref 36.0–46.0)
HEMOGLOBIN: 9.6 g/dL — AB (ref 12.0–15.0)
MCH: 28.2 pg (ref 26.0–34.0)
MCHC: 33.1 g/dL (ref 30.0–36.0)
MCV: 85 fL (ref 78.0–100.0)
Platelets: 212 10*3/uL (ref 150–400)
RBC: 3.41 MIL/uL — AB (ref 3.87–5.11)
RDW: 14.9 % (ref 11.5–15.5)
WBC: 12.2 10*3/uL — ABNORMAL HIGH (ref 4.0–10.5)

## 2015-05-05 LAB — GLUCOSE, CAPILLARY: GLUCOSE-CAPILLARY: 60 mg/dL — AB (ref 65–99)

## 2015-05-05 NOTE — Addendum Note (Signed)
Addendum  created 05/05/15 0730 by Algis Greenhouse, CRNA   Modules edited: Clinical Notes   Clinical Notes:  File: [id number]

## 2015-05-05 NOTE — Addendum Note (Signed)
Addendum  created 05/05/15 0729 by Algis Greenhouse, CRNA   Modules edited: Charges VN, Clinical Notes   Clinical Notes:  File: [id number]

## 2015-05-05 NOTE — Anesthesia Postprocedure Evaluation (Signed)
Anesthesia Post Note  Patient: Anna Potter  Procedure(s) Performed: Procedure(s) (LRB): CESAREAN SECTION (N/A)  Patient location during evaluation: Mother Baby Anesthesia Type: Epidural Level of consciousness: awake Pain management: satisfactory to patient Vital Signs Assessment: post-procedure vital signs reviewed and stable Respiratory status: spontaneous breathing Cardiovascular status: stable Anesthetic complications: no    Last Vitals:  Filed Vitals:   05/04/15 2354 05/05/15 0521  BP: 107/48 114/58  Pulse: 75 73  Temp: 37.2 C 36.8 C  Resp: 18 20    Last Pain:  Filed Vitals:   05/05/15 0640  PainSc: 2                  Nazire Fruth

## 2015-05-05 NOTE — Progress Notes (Signed)
When i did my assessment she had a tape foam drsg and could see honeycomb underneath completely saturated   So i removed tape drsg and honeycomb   steris still intact but appeared to be leaking small amt of blood    4x4 applied to leaking area and new honeycomb   Did not see any open areas

## 2015-05-05 NOTE — Anesthesia Postprocedure Evaluation (Signed)
Anesthesia Post Note  Patient: Anna Potter  Procedure(s) Performed: Procedure(s) (LRB): CESAREAN SECTION (N/A)  Patient location during evaluation: Mother Baby Anesthesia Type: Spinal Level of consciousness: awake Pain management: satisfactory to patient Vital Signs Assessment: post-procedure vital signs reviewed and stable Respiratory status: spontaneous breathing Cardiovascular status: stable Anesthetic complications: no    Last Vitals:  Filed Vitals:   05/04/15 2354 05/05/15 0521  BP: 107/48 114/58  Pulse: 75 73  Temp: 37.2 C 36.8 C  Resp: 18 20    Last Pain:  Filed Vitals:   05/05/15 0640  PainSc: 2                  [name]

## 2015-05-05 NOTE — Progress Notes (Signed)
Subjective: Postpartum Day #1: Cesarean Delivery Patient reports tolerating PO, + flatus and no problems voiding.  Breastfeeding going well; desires Mirena for PP contraception.  Objective: Vital signs in last 24 hours: Temp:  [97.5 F (36.4 C)-99 F (37.2 C)] 98.3 F (36.8 C) (01/28 0521) Pulse Rate:  [54-98] 73 (01/28 0521) Resp:  [13-24] 20 (01/28 0521) BP: (104-127)/(48-90) 114/58 mmHg (01/28 0521) SpO2:  [89 %-100 %] 96 % (01/28 0521)  Physical Exam:  General: alert, cooperative and no distress Lochia: appropriate Uterine Fundus: firm Incision: honeycomb intact; sm area of bloody drainage DVT Evaluation: No evidence of DVT seen on physical exam.   Recent Labs  05/03/15 1440 05/05/15 0535  HGB 10.2* 9.6*  HCT 31.4* 29.0*    Assessment/Plan: Status post Cesarean section. Doing well postoperatively.  Continue current care. Anticipate d/c on 05/06/15.  Cam Hai CNM 05/05/2015, 7:29 AM

## 2015-05-06 LAB — BIRTH TISSUE RECOVERY COLLECTION (PLACENTA DONATION)

## 2015-05-06 NOTE — Progress Notes (Signed)
Post Partum Day 2  Subjective:  Anna Potter is a 30 y.o. G3P3003 [redacted]w[redacted]d s/p rLTCS.  No acute events overnight.  Pt denies problems with ambulating, voiding or po intake.  She denies nausea or vomiting.  Pain is well controlled.  She has had flatus. She has not had bowel movement.  Lochia Small.  Plan for birth control is IUD.  Method of Feeding: breast  Objective: BP 100/44 mmHg  Pulse 57  Temp(Src) 98.2 F (36.8 C) (Oral)  Resp 16  SpO2 99%  LMP 08/04/2014 (LMP Unknown)  Breastfeeding? Unknown  Physical Exam:  General: alert, cooperative and no distress Lochia:normal flow Chest: CTAB Heart: RRR no m/r/g Abdomen: +BS, soft, nontender, fundus firm at/below umbilicus Uterine Fundus: firm DVT Evaluation: No evidence of DVT seen on physical exam. Extremities: no edema   Recent Labs  05/03/15 1440 05/05/15 0535  HGB 10.2* 9.6*  HCT 31.4* 29.0*    Assessment/Plan:  ASSESSMENT: Anna Potter is a 30 y.o. G3P3003 [redacted]w[redacted]d pod #2 s/p rLTCS doing well.   Plan for discharge tomorrow  Continue to work with lactation    LOS: 2 days   Palma Holter 05/06/2015, 7:52 AM

## 2015-05-06 NOTE — Lactation Note (Signed)
This note was copied from the chart of [name]. Lactation Consultation Note  Patient Name: [name] [id number] Date: 05/06/2015 Reason for consult: Follow-up assessment  Mom reports hearing swallows when baby is at breast. Mom would like assist on helping baby get wider gape. Mom has my # to call for assist w/next feeding.  Mom reports having a good milk supply w/her 2 previous children. "Luevenia Maxin" has already had 13 BMs & 12 voids in the first 2 days of life. Lurline Hare [facility] 05/06/2015, 3:49 PM

## 2015-05-07 ENCOUNTER — Encounter (HOSPITAL_COMMUNITY): Payer: Self-pay | Admitting: Obstetrics & Gynecology

## 2015-05-07 MED ORDER — IBUPROFEN 600 MG PO TABS: 600.0000 mg | ORAL_TABLET | Freq: Four times a day (QID) | ORAL | Status: AC | PRN

## 2015-05-07 NOTE — Discharge Summary (Signed)
OB Discharge Summary  Patient Name: Anna Potter DOB: 1985/08/18 MRN: [id number]  Date of admission: 05/04/2015 Delivering MD: Adam Phenix   Date of discharge: 05/07/2015  Admitting diagnosis: repeat cesarean section Intrauterine pregnancy: [redacted]w[redacted]d     Secondary diagnosis:Active Problems:   S/P C-section  Additional problems:none     Discharge diagnosis: Term Pregnancy Delivered                                                                     Post partum procedures:none  Augmentation: none  Complications: None  Hospital course:  Onset of Labor With Vaginal Delivery     30 y.o. yo G3P3003 at [redacted]w[redacted]d was admitted in Active Labor on 05/04/2015. Patient had an uncomplicated labor course as follows:  Membrane Rupture Time/Date: 1:45 PM ,05/04/2015   Intrapartum Procedures: Episiotomy:                                           Lacerations:     Patient had a delivery of a Viable infant. 05/04/2015  Information for the patient's newborn:  Tiari, Andringa [[id number]]  Delivery Method: C-Section, Vacuum Assisted (Filed from Delivery Summary)    Pateint had an uncomplicated postpartum course.  She is ambulating, tolerating a regular diet, passing flatus, and urinating well. Patient is discharged home in stable condition on 05/07/2015.    Physical exam  Filed Vitals:   05/06/15 0601 05/06/15 1813 05/07/15 0538 05/07/15 0552  BP: 100/44 128/84 130/77 127/85  Pulse: 57 73 95 72  Temp: 98.2 F (36.8 C) 99 F (37.2 C) 97.9 F (36.6 C) 97.6 F (36.4 C)  TempSrc: Oral Oral Oral Oral  Resp: SpO2:  100% 100% 100%   General: alert, cooperative and no distress Lochia: appropriate Uterine Fundus: firm Incision: N/A DVT Evaluation: Negative Homan's sign. No cords or calf tenderness. Labs: Lab Results  Component Value Date   WBC 12.2* 05/05/2015   HGB 9.6* 05/05/2015   HCT 29.0* 05/05/2015   MCV 85.0 05/05/2015   PLT 212 05/05/2015   CMP Latest  Ref Rng 05/03/2015  Glucose 65 - 99 mg/dL 782(N)  BUN 6 - 20 mg/dL 9  Creatinine 5.62 - 1.30 mg/dL 8.65  Sodium 784 - 696 mmol/L 138  Potassium 3.5 - 5.1 mmol/L 3.7  Chloride 101 - 111 mmol/L 107  CO2 22 - 32 mmol/L 22  Calcium 8.9 - 10.3 mg/dL 2.9(B)    Discharge instruction: per After Visit Summary and "Baby and Me Booklet".  After Visit Meds:    Medication List    ASK your doctor about these medications        FUSION PLUS Caps  Take 1 capsule by mouth daily.     prenatal multivitamin Tabs tablet  Take 1 tablet by mouth daily at 12 noon.     triamcinolone cream 0.1 %  Commonly known as:  KENALOG  Apply 1 application topically 2 (two) times daily.        Diet: routine diet  Activity: Advance as tolerated. Pelvic rest for 6 weeks.   Outpatient follow up:6  weeks Follow up Appt:Future Appointments Date Time Provider Department Center  06/13/2015 3:00 PM Willodean Rosenthal, MD [facility] None   Follow up visit: No Follow-up on file.  Postpartum contraception: IUD Mirena  Newborn Data: Live born female  Birth Weight: 7 lb 11.1 oz (3490 g) APGAR: 8, 9  Baby Feeding: Breast Disposition:home with mother   05/07/2015 Ferdie Ping, CNM

## 2015-06-13 ENCOUNTER — Ambulatory Visit: Payer: Medicaid Other | Admitting: Obstetrics & Gynecology

## 2015-08-05 ENCOUNTER — Encounter (HOSPITAL_BASED_OUTPATIENT_CLINIC_OR_DEPARTMENT_OTHER): Payer: Self-pay | Admitting: *Deleted

## 2015-08-05 ENCOUNTER — Emergency Department (HOSPITAL_BASED_OUTPATIENT_CLINIC_OR_DEPARTMENT_OTHER)
Admission: EM | Admit: 2015-08-05 | Discharge: 2015-08-06 | Disposition: A | Discharge status: Home / Self Care | Payer: Medicaid Other | Attending: Emergency Medicine | Admitting: Emergency Medicine

## 2015-08-05 DIAGNOSIS — F17290 Nicotine dependence, other tobacco product, uncomplicated: Secondary | ICD-10-CM | POA: Insufficient documentation

## 2015-08-05 DIAGNOSIS — K2970 Gastritis, unspecified, without bleeding: Secondary | ICD-10-CM | POA: Insufficient documentation

## 2015-08-05 NOTE — ED Provider Notes (Signed)
CSN: [id number]     Arrival date & time 08/05/15  2218 History  By signing my name below, I, [facility]arr[facility] Washington, attest that this documentation has been prepared under the direction and in the presence of Paula LibraJohn Mathius Birkeland, MD. Electronically Signed: Randell PatientMarrissa Washington, ED Scribe. 08/06/2015. 1:03 AM.    Chief Complaint  Patient presents with  . Abdominal Pain   The history is provided by the patient. No language interpreter was used.   HPI Comments: Julius BowelsLashonda Pinn is a 30 y.o. female who presents to the Emergency Department complaining of constant, moderate, epigastric abdominal pain onset 1 day ago. Patient is unable to describe the pain. She reports associated nausea and decreased appetite but no vomiting or diarrhea. Pain improves with pressure on her epigastrium. She is not sure if food makes it better or worse. She notes similar pain last year that resolved with treatment at [facility] when she received a white fluid to drink.   Past Medical History  Diagnosis Date  . Diabetes mellitus without complication (HCC)     gestational  . GERD (gastroesophageal reflux disease)     with pregnancy  . Headache   . Anemia    Past Surgical History  Procedure Laterality Date  . Cesarean section    . Cesarean section N/A 05/04/2015    Procedure: CESAREAN SECTION;  Surgeon: Adam PhenixJames G Arnold, MD;  Location: [facility];  Service: Obstetrics;  Laterality: N/A;   Family History  Problem Relation Age of Onset  . Hypertension Mother   . Diabetes Father    Social History  Substance Use Topics  . Smoking status: Current Every Day Smoker -- .3 years    Types: E-cigarettes    Last Attempt to Quit: 09/06/2014  . Smokeless tobacco: Never Used  . Alcohol Use: Yes   OB History    Gravida Para Term Preterm AB TAB SAB Ectopic Multiple Living   3 3 3       0 3     Review of Systems A complete 10 system review of systems was obtained and all systems are negative except as noted in the HPI and PMH.    Allergies  Review of patient's allergies indicates no known allergies.  Home Medications   Prior to Admission medications   Medication Sig Start Date End Date Taking? Authorizing Provider  ibuprofen (ADVIL,MOTRIN) 600 MG tablet Take 1 tablet (600 mg total) by mouth every 6 (six) hours as needed for mild pain. 05/07/15   Montez MoritaMarie D Lawson, CNM  Iron-FA-B Cmp-C-Biot-Probiotic (FUSION PLUS) CAPS Take 1 capsule by mouth daily. Patient not taking: Reported on 05/04/2015 01/23/15   Allie BossierMyra C Dove, MD  Prenatal Vit-Fe Fumarate-FA (PRENATAL MULTIVITAMIN) TABS tablet Take 1 tablet by mouth daily at 12 noon. 01/24/15   Allie BossierMyra C Dove, MD  triamcinolone cream (KENALOG) 0.1 % Apply 1 application topically 2 (two) times daily. 02/21/15   Rhona RaiderJacob J Stinson, DO   BP 129/92 mmHg  Pulse 71  Temp(Src) 97.9 F (36.6 C) (Oral)  Resp 20  Ht 5\' 5"  (1.651 m)  Wt 190 lb (86.183 kg)  BMI 31.62 kg/m2  SpO2 100%  LMP 07/07/2015   Physical Exam  Nursing note and vitals reviewed. General: Well-developed, well-nourished female in no acute distress; appearance consistent with age of record HENT: normocephalic; atraumatic Eyes: pupils equal, round and reactive to light; extraocular muscles intact Neck: supple Heart: regular rate and rhythm Lungs: clear to auscultation bilaterally Abdomen: soft; nondistended; nontender; no masses or hepatosplenomegaly; bowel  sounds present Extremities: No deformity; full range of motion; pulses normal Neurologic: Awake, alert and oriented; motor function intact in all extremities and symmetric; no facial droop Skin: Warm and dry Psychiatric: Normal mood and affect  ED Course  Procedures   DIAGNOSTIC STUDIES: Oxygen Saturation is 100% on RA, normal by my interpretation.    COORDINATION OF CARE: 11:59 PM Will order Carafate. Discussed treatment plan with pt at bedside and pt agreed to plan.  MDM  Partial relief with Carafate. Suspect gastritis. Will start on a PPI and  Carafate.  Final diagnoses:  Gastritis   I personally performed the services described in this documentation, which was scribed in my presence. The recorded information has been reviewed and is accurate.    Paula Libra, MD 08/06/15 [phone]

## 2015-08-05 NOTE — ED Notes (Signed)
Pt states upper abdominal pain, "feels like something is stuck" since yesterday. Occurred after eating and reports the discomfort will not go away. Pt says she had similar pain previously and was seen in the ED and was "given something to drink" that helped the pain. Was given f/u for GI and did not f/u as directed.

## 2015-08-06 MED ORDER — OMEPRAZOLE 40 MG PO CPDR: DELAYED_RELEASE_CAPSULE | ORAL | Status: AC

## 2015-08-06 MED ORDER — SUCRALFATE 1 G PO TABS
1.0000 g | ORAL_TABLET | Freq: Once | ORAL | Status: AC
  Administered 2015-08-06: 1 g via ORAL
  Filled 2015-08-06: qty 1

## 2015-08-06 MED ORDER — SUCRALFATE 1 G PO TABS: 1.0000 g | ORAL_TABLET | Freq: Three times a day (TID) | ORAL | Status: AC

## 2015-08-06 MED ORDER — PANTOPRAZOLE SODIUM 40 MG PO TBEC
40.0000 mg | DELAYED_RELEASE_TABLET | Freq: Once | ORAL | Status: AC
  Administered 2015-08-06: 40 mg via ORAL
  Filled 2015-08-06: qty 1

## 2015-08-06 NOTE — Discharge Instructions (Signed)

## 2018-07-17 ENCOUNTER — Encounter (HOSPITAL_BASED_OUTPATIENT_CLINIC_OR_DEPARTMENT_OTHER): Payer: Self-pay | Admitting: Emergency Medicine

## 2018-07-17 ENCOUNTER — Other Ambulatory Visit: Payer: Self-pay

## 2018-07-17 ENCOUNTER — Emergency Department (HOSPITAL_BASED_OUTPATIENT_CLINIC_OR_DEPARTMENT_OTHER)
Admission: EM | Admit: 2018-07-17 | Discharge: 2018-07-17 | Disposition: A | Discharge status: Home / Self Care | Payer: 59 | Attending: Emergency Medicine | Admitting: Emergency Medicine

## 2018-07-17 DIAGNOSIS — Z79899 Other long term (current) drug therapy: Secondary | ICD-10-CM | POA: Insufficient documentation

## 2018-07-17 DIAGNOSIS — Y939 Activity, unspecified: Secondary | ICD-10-CM | POA: Diagnosis not present

## 2018-07-17 DIAGNOSIS — E119 Type 2 diabetes mellitus without complications: Secondary | ICD-10-CM | POA: Diagnosis not present

## 2018-07-17 DIAGNOSIS — Y999 Unspecified external cause status: Secondary | ICD-10-CM | POA: Insufficient documentation

## 2018-07-17 DIAGNOSIS — Z87891 Personal history of nicotine dependence: Secondary | ICD-10-CM | POA: Diagnosis not present

## 2018-07-17 DIAGNOSIS — S39012A Strain of muscle, fascia and tendon of lower back, initial encounter: Secondary | ICD-10-CM | POA: Diagnosis not present

## 2018-07-17 DIAGNOSIS — Y92410 Unspecified street and highway as the place of occurrence of the external cause: Secondary | ICD-10-CM | POA: Diagnosis not present

## 2018-07-17 DIAGNOSIS — S3992XA Unspecified injury of lower back, initial encounter: Secondary | ICD-10-CM | POA: Diagnosis present

## 2018-07-17 MED ORDER — CYCLOBENZAPRINE HCL 10 MG PO TABS
10.0000 mg | ORAL_TABLET | Freq: Two times a day (BID) | ORAL | 0 refills | Status: AC | PRN
Start: 2018-07-17 — End: ?

## 2018-07-17 NOTE — ED Notes (Signed)
Has difficulty turning head to the left.

## 2018-07-17 NOTE — ED Notes (Signed)
Unable to obtain esignature at time of discharge - computer not working
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
## 2018-07-17 NOTE — Discharge Instructions (Signed)
Please read instructions below. Apply ice to your areas of pain for 20 minutes at a time. You can take tylenol every 6 hours as needed for pain. You can take flexeril every 12 hours as needed for muscle spasm. Be aware this medication can make you drowsy. Schedule an appointment with your primary care provider to follow up on your visit today. Return to the ER for severely worsening headache, vision changes, if new numbness or tingling in your arms or legs, inability to urinate, inability to hold your bowels, or weakness in your extremities.   
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
## 2020-03-30 ENCOUNTER — Emergency Department (HOSPITAL_BASED_OUTPATIENT_CLINIC_OR_DEPARTMENT_OTHER)
Admission: EM | Admit: 2020-03-30 | Discharge: 2020-03-30 | Disposition: A | Discharge status: Home / Self Care | Payer: 59 | Attending: Emergency Medicine | Admitting: Emergency Medicine

## 2020-03-30 ENCOUNTER — Encounter (HOSPITAL_BASED_OUTPATIENT_CLINIC_OR_DEPARTMENT_OTHER): Payer: Self-pay

## 2020-03-30 ENCOUNTER — Other Ambulatory Visit: Payer: Self-pay

## 2020-03-30 DIAGNOSIS — Z87891 Personal history of nicotine dependence: Secondary | ICD-10-CM | POA: Insufficient documentation

## 2020-03-30 DIAGNOSIS — D539 Nutritional anemia, unspecified: Secondary | ICD-10-CM | POA: Insufficient documentation

## 2020-03-30 DIAGNOSIS — D649 Anemia, unspecified: Secondary | ICD-10-CM

## 2020-03-30 DIAGNOSIS — A5901 Trichomonal vulvovaginitis: Secondary | ICD-10-CM | POA: Insufficient documentation

## 2020-03-30 DIAGNOSIS — R1010 Upper abdominal pain, unspecified: Secondary | ICD-10-CM

## 2020-03-30 DIAGNOSIS — E119 Type 2 diabetes mellitus without complications: Secondary | ICD-10-CM | POA: Insufficient documentation

## 2020-03-30 LAB — URINALYSIS, ROUTINE W REFLEX MICROSCOPIC
Bilirubin Urine: NEGATIVE
Glucose, UA: NEGATIVE mg/dL
Hgb urine dipstick: NEGATIVE
Ketones, ur: 80 mg/dL — AB
Leukocytes,Ua: NEGATIVE
Nitrite: NEGATIVE
Protein, ur: NEGATIVE mg/dL
Specific Gravity, Urine: 1.015 (ref 1.005–1.030)
pH: 8 (ref 5.0–8.0)

## 2020-03-30 LAB — HIV ANTIBODY (ROUTINE TESTING W REFLEX): HIV Screen 4th Generation wRfx: NONREACTIVE

## 2020-03-30 LAB — CBC WITH DIFFERENTIAL/PLATELET
Abs Immature Granulocytes: 0.04 10*3/uL (ref 0.00–0.07)
Basophils Absolute: 0 10*3/uL (ref 0.0–0.1)
Basophils Relative: 1 %
Eosinophils Absolute: 0 10*3/uL (ref 0.0–0.5)
Eosinophils Relative: 0 %
HCT: 26.8 % — ABNORMAL LOW (ref 36.0–46.0)
Hemoglobin: 7.9 g/dL — ABNORMAL LOW (ref 12.0–15.0)
Immature Granulocytes: 1 %
Lymphocytes Relative: 23 %
Lymphs Abs: 2 10*3/uL (ref 0.7–4.0)
MCH: 19.6 pg — ABNORMAL LOW (ref 26.0–34.0)
MCHC: 29.5 g/dL — ABNORMAL LOW (ref 30.0–36.0)
MCV: 66.5 fL — ABNORMAL LOW (ref 80.0–100.0)
Monocytes Absolute: 0.7 10*3/uL (ref 0.1–1.0)
Monocytes Relative: 8 %
Neutro Abs: 6 10*3/uL (ref 1.7–7.7)
Neutrophils Relative %: 67 %
Platelets: 358 10*3/uL (ref 150–400)
RBC: 4.03 MIL/uL (ref 3.87–5.11)
RDW: 20.1 % — ABNORMAL HIGH (ref 11.5–15.5)
Smear Review: ADEQUATE
WBC: 8.8 10*3/uL (ref 4.0–10.5)
nRBC: 0 % (ref 0.0–0.2)

## 2020-03-30 LAB — COMPREHENSIVE METABOLIC PANEL
ALT: 10 U/L (ref 0–44)
AST: 22 U/L (ref 15–41)
Albumin: 3.7 g/dL (ref 3.5–5.0)
Alkaline Phosphatase: 32 U/L — ABNORMAL LOW (ref 38–126)
Anion gap: 10 (ref 5–15)
BUN: 7 mg/dL (ref 6–20)
CO2: 24 mmol/L (ref 22–32)
Calcium: 8.3 mg/dL — ABNORMAL LOW (ref 8.9–10.3)
Chloride: 102 mmol/L (ref 98–111)
Creatinine, Ser: 0.53 mg/dL (ref 0.44–1.00)
GFR, Estimated: >60 mL/min (ref >60)
Glucose, Bld: 77 mg/dL (ref 70–99)
Potassium: 3.5 mmol/L (ref 3.5–5.1)
Sodium: 136 mmol/L (ref 135–145)
Total Bilirubin: 0.9 mg/dL (ref 0.3–1.2)
Total Protein: 6.7 g/dL (ref 6.5–8.1)

## 2020-03-30 LAB — WET PREP, GENITAL
Sperm: NONE SEEN
Yeast Wet Prep HPF POC: NONE SEEN

## 2020-03-30 MED ORDER — METRONIDAZOLE 500 MG PO TABS
500.0000 mg | ORAL_TABLET | Freq: Once | ORAL | Status: AC
  Administered 2020-03-30: 500 mg via ORAL
  Filled 2020-03-30: qty 1

## 2020-03-30 MED ORDER — METRONIDAZOLE 500 MG PO TABS: 500.0000 mg | ORAL_TABLET | Freq: Two times a day (BID) | ORAL | 0 refills | Status: DC

## 2020-03-30 MED ORDER — DOXYCYCLINE HYCLATE 100 MG PO TABS
100.0000 mg | ORAL_TABLET | Freq: Once | ORAL | Status: AC
  Administered 2020-03-30: 100 mg via ORAL
  Filled 2020-03-30: qty 1

## 2020-03-30 MED ORDER — FENTANYL CITRATE (PF) 100 MCG/2ML IJ SOLN
50.0000 ug | Freq: Once | INTRAMUSCULAR | Status: AC
  Administered 2020-03-30: 50 ug via INTRAVENOUS
  Filled 2020-03-30: qty 2

## 2020-03-30 MED ORDER — DOXYCYCLINE HYCLATE 100 MG PO CAPS: 100.0000 mg | ORAL_CAPSULE | Freq: Two times a day (BID) | ORAL | 0 refills | Status: AC

## 2020-03-30 MED ORDER — SODIUM CHLORIDE 0.9 % IV BOLUS
1000.0000 mL | Freq: Once | INTRAVENOUS | Status: AC
  Administered 2020-03-30: 1000 mL via INTRAVENOUS

## 2020-03-30 MED ORDER — OMEPRAZOLE 20 MG PO CPDR: 20.0000 mg | DELAYED_RELEASE_CAPSULE | Freq: Every day | ORAL | 1 refills | Status: AC

## 2020-03-30 MED ORDER — CEFTRIAXONE SODIUM 500 MG IJ SOLR
500.0000 mg | Freq: Once | INTRAMUSCULAR | Status: AC
  Administered 2020-03-30: 500 mg via INTRAMUSCULAR
  Filled 2020-03-30: qty 500

## 2020-03-30 MED ORDER — KETOROLAC TROMETHAMINE 30 MG/ML IJ SOLN
30.0000 mg | Freq: Once | INTRAMUSCULAR | Status: AC
  Administered 2020-03-30: 30 mg via INTRAVENOUS
  Filled 2020-03-30: qty 1

## 2020-03-30 MED ORDER — ALUM & MAG HYDROXIDE-SIMETH 200-200-20 MG/5ML PO SUSP
30.0000 mL | Freq: Once | ORAL | Status: AC
  Administered 2020-03-30: 30 mL via ORAL
  Filled 2020-03-30: qty 30

## 2020-03-30 MED ORDER — CEFTRIAXONE SODIUM 500 MG IJ SOLR
INTRAMUSCULAR | Status: AC
  Filled 2020-03-30: qty 500

## 2020-03-30 MED ORDER — DEXTROSE 5 % IV SOLN
500.0000 mg | Freq: Once | INTRAVENOUS | Status: DC
  Filled 2020-03-30: qty 500

## 2020-03-30 MED ORDER — ONDANSETRON 4 MG PO TBDP: 4.0000 mg | ORAL_TABLET | Freq: Three times a day (TID) | ORAL | 0 refills | Status: AC | PRN

## 2020-03-30 MED ORDER — FENTANYL CITRATE (PF) 100 MCG/2ML IJ SOLN: 50.0000 ug | Freq: Once | INTRAMUSCULAR | Status: DC

## 2020-03-30 MED ORDER — ONDANSETRON HCL 4 MG/2ML IJ SOLN
4.0000 mg | Freq: Once | INTRAMUSCULAR | Status: AC
  Administered 2020-03-30: 4 mg via INTRAVENOUS
  Filled 2020-03-30: qty 2

## 2020-03-30 NOTE — ED Provider Notes (Signed)
[facility] EMERGENCY DEPARTMENT Provider Note   CSN: [id number] Arrival date & time: 03/30/20  1736     History Chief Complaint  Patient presents with   Vaginal Discharge   Abdominal Pain    Anna Potter is a 34 y.o. female presenting for evaluation of abdominal pain and vaginal discharge.  She began having central abdominal pain on December 21 with nausea and vomiting.  The pain is dull and aching with intermittent sharp stabbing pains.  She is unable to keep much fluid down secondary to the nausea and vomiting.  She has not had any fevers.  No diarrhea or constipation, no dysuria or urinary frequency.  Per chart review, she was evaluated at a [facility] emergency department yesterday where she had CT imaging of her abdomen pelvis and blood work done.  She was discharged with Keflex for UTI, also diagnosed with a pelvic congestion syndrome via CT.  She states she has been unable to keep the Keflex down secondary to the nausea vomiting, she was not prescribed any antiemetics.  She has never had similar symptoms before.  She states today she developed a malodorous vaginal discharge.  No history of PID though does have history of gonorrhea.  She is sexually active with a female partner, intermittently uses protection.   The history is provided by the patient and medical records.       Past Medical History:  Diagnosis Date   Anemia    Diabetes mellitus without complication (HCC)    gestational   GERD (gastroesophageal reflux disease)    with pregnancy   Headache     Patient Active Problem List   Diagnosis Date Noted   S/P C-section 05/04/2015   Fetal echogenic bowel of fetus 01/25/2015   Obesity affecting pregnancy in first trimester 01/15/2015   Previous cesarean delivery, delivered 01/15/2015   History of gestational diabetes in prior pregnancy, currently pregnant in first trimester 01/15/2015   Encounter for supervision of other normal pregnancy in first  trimester 01/15/2015   Late prenatal care in third trimester 01/15/2015    Past Surgical History:  Procedure Laterality Date   CESAREAN SECTION     CESAREAN SECTION N/A 05/04/2015   Procedure: CESAREAN SECTION;  Surgeon: Adam Phenix, MD;  Location: [facility];  Service: Obstetrics;  Laterality: N/A;     OB History    Gravida  3   Para  3   Term  3   Preterm      AB      Living  3     SAB      IAB      Ectopic      Multiple  0   Live Births  3           Family History  Problem Relation Age of Onset   Hypertension Mother    Diabetes Father     Social History   Tobacco Use   Smoking status: Former Smoker    Years: 0.30    Types: E-cigarettes    Quit date: 09/06/2014    Years since quitting: 5.5   Smokeless tobacco: Never Used  Substance Use Topics   Alcohol use: Never   Drug use: No    Home Medications Prior to Admission medications   Medication Sig Start Date End Date Taking? Authorizing Provider  cyclobenzaprine (FLEXERIL) 10 MG tablet Take 1 tablet (10 mg total) by mouth 2 (two) times daily as needed for muscle spasms. 07/17/18   Roxan Hockey,  [place] N, PA-C  ibuprofen (ADVIL,MOTRIN) 600 MG tablet Take 1 tablet (600 mg total) by mouth every 6 (six) hours as needed for mild pain. 05/07/15   Montez Morita, CNM  omeprazole (PRILOSEC) 40 MG capsule Take one capsule daily at least 30 minutes before first dose of Carafate. 08/06/15   Molpus, John, MD  Prenatal Vit-Fe Fumarate-FA (PRENATAL MULTIVITAMIN) TABS tablet Take 1 tablet by mouth daily at 12 noon. 01/24/15   Allie Bossier, MD  sucralfate (CARAFATE) 1 g tablet Take 1 tablet (1 g total) by mouth 4 (four) times daily -  with meals and at bedtime. 08/06/15   Molpus, John, MD  triamcinolone cream (KENALOG) 0.1 % Apply 1 application topically 2 (two) times daily. 02/21/15   Levie Heritage, DO    Allergies    Patient has no known allergies.  Review of Systems   Review of Systems  Gastrointestinal:  Positive for abdominal pain, nausea and vomiting.  Genitourinary: Positive for vaginal discharge.  All other systems reviewed and are negative.   Physical Exam Updated Vital Signs BP 139/90    Pulse (!) 53    Temp 98.5 F (36.9 C) (Oral)    Resp 18    Ht 5\' 6"  (1.676 m)    Wt 59 kg    LMP 03/16/2020    SpO2 100%    BMI 20.98 kg/m   Physical Exam Vitals and nursing note reviewed.  Constitutional:      Appearance: She is well-developed and well-nourished.  HENT:     Head: Normocephalic and atraumatic.  Eyes:     Conjunctiva/sclera: Conjunctivae normal.  Cardiovascular:     Rate and Rhythm: Normal rate and regular rhythm.  Pulmonary:     Effort: Pulmonary effort is normal. No respiratory distress.     Breath sounds: Normal breath sounds.  Abdominal:     General: Abdomen is flat. Bowel sounds are normal.     Palpations: Abdomen is soft.     Tenderness: There is generalized abdominal tenderness. There is no guarding or rebound.  Genitourinary:    Adnexa:        Right: No fullness.         Left: No fullness.       Comments: Exam performed with female RN chaperone present.  There is moderate to profuse amount of grayish-white discharge present.  No CMT or adnexal tenderness. Skin:    General: Skin is warm.  Neurological:     Mental Status: She is alert.  Psychiatric:        Mood and Affect: Mood and affect normal.        Behavior: Behavior normal.     ED Results / Procedures / Treatments   Labs (all labs ordered are listed, but only abnormal results are displayed) Labs Reviewed  WET PREP, GENITAL - Abnormal; Notable for the following components:      Result Value   Trich, Wet Prep PRESENT (*)    Clue Cells Wet Prep HPF POC PRESENT (*)    WBC, Wet Prep HPF POC MANY (*)    All other components within normal limits  URINALYSIS, ROUTINE W REFLEX MICROSCOPIC - Abnormal; Notable for the following components:   APPearance HAZY (*)    Ketones, ur 80 (*)    All other  components within normal limits  CBC WITH DIFFERENTIAL/PLATELET - Abnormal; Notable for the following components:   Hemoglobin 7.9 (*)    HCT 26.8 (*)    MCV 66.5 (*)  MCH 19.6 (*)    MCHC 29.5 (*)    RDW 20.1 (*)    All other components within normal limits  COMPREHENSIVE METABOLIC PANEL - Abnormal; Notable for the following components:   Calcium 8.3 (*)    Alkaline Phosphatase 32 (*)    All other components within normal limits  CBC WITH DIFFERENTIAL/PLATELET  RPR  HIV ANTIBODY (ROUTINE TESTING W REFLEX)  GC/CHLAMYDIA PROBE AMP ([place]) NOT AT [facility]    EKG None  Radiology No results found.  Procedures Procedures (including critical care time)  Medications Ordered in ED Medications  cefTRIAXone (ROCEPHIN) 500 MG injection (  Canceled Entry 03/30/20 2244)  sodium chloride 0.9 % bolus 1,000 mL (0 mLs Intravenous Stopped 03/30/20 2118)  ondansetron (ZOFRAN) injection 4 mg (4 mg Intravenous Given 03/30/20 2016)  ketorolac (TORADOL) 30 MG/ML injection 30 mg (30 mg Intravenous Given 03/30/20 2015)  fentaNYL (SUBLIMAZE) injection 50 mcg (50 mcg Intravenous Given 03/30/20 2222)  doxycycline (VIBRA-TABS) tablet 100 mg (100 mg Oral Given 03/30/20 2247)  metroNIDAZOLE (FLAGYL) tablet 500 mg (500 mg Oral Given 03/30/20 2247)  alum & mag hydroxide-simeth (MAALOX/MYLANTA) 200-200-20 MG/5ML suspension 30 mL (30 mLs Oral Given 03/30/20 2247)  cefTRIAXone (ROCEPHIN) injection 500 mg (500 mg Intramuscular Given 03/30/20 2247)    ED Course  I have reviewed the triage vital signs and the nursing notes.  Pertinent labs & imaging results that were available during my care of the patient were reviewed by me and considered in my medical decision making (see chart for details).    MDM Rules/Calculators/A&P                          Patient presenting to the ED for evaluation of mid abdominal pain began 4 days ago with nausea and vomiting.  She was evaluated at [facility] ED yesterday and  had CT scan of her abdomen pelvis which showed pelvic congestion syndrome otherwise was negative.  She had laboratory work-up done which was also reassuring, she was treated with Keflex for UTI.  Patient states she continues to have burning epigastric to periumbilical abdominal pain with intermittent sharp pain crampy pain.  She is continue to have nausea and vomiting, she was not prescribed any antiemetics to take with her antibiotic.  She noticed new vaginal discharge today.  She is sexually active with one female partner.  No fevers or chills.  On exam, patient has generalized tenderness to the abdomen, no guarding or rebound.  She is afebrile with stable vital signs.  She is treated with IV fluids, antiemetics, Toradol for pain.  Repeat laboratory work-up ordered.  Pelvic exam performed with malodorous vaginal discharge present, no CMT or adnexal tenderness.  Wet prep is positive for trichomonas.  GC/chlamydia, RPR, HIV sent.  Labs are reassuring, no leukocytosis.  She does have anemia present which is slightly down from yesterday though she did receive IV fluids yesterday in the ED.  She not having any bleeding, she is asymptomatic regarding anemia.  This seems chronic in nature.  UA today is negative.  Work-up and care plan discussed with attending Dr. Criss Alvine.  Will cover for GC/chlamydia considering positive trichomonas with Rocephin and doxycycline.  We will also prescribe Flagyl for trichomonas.  Recommend she discontinue the Keflex in the absence of urinary symptoms or UTI on UA.  Consider other causes of patient's mid abdominal pain including gastritis versus PUD is viral gastroenteritis.  Recommend she treat with PPI, some diet  modifications as she endorses quite a bit of spicy and acidic foods regularly.  No frequent NSAID use.   Patient states she Rosita KeaMenz much better after interventions, she is tolerating p.o.  She is agreeable with care plan at this time.  She will be discharged with instruction  to begin PPI, diet modification, Zofran as needed for nausea, doxycycline and Flagyl, discontinue Keflex.  Follow-up with gynecology, she has appointment on December 28, and follow with PCP for chronic anemia and abdominal pain.  Return if worsening pain, intractable vomiting, fever, or other concerning symptoms  Discussed results, findings, treatment and follow up. Patient advised of return precautions. Patient verbalized understanding and agreed with plan.   Final Clinical Impression(s) / ED Diagnoses Final diagnoses:  Trichomonas vaginitis  Upper abdominal pain  Chronic anemia    Rx / DC Orders ED Discharge Orders    None       Joyel Chenette, [place] N, PA-C 03/30/20 2255    Pricilla LovelessGoldston, Scott, MD 03/31/20 [phone]

## 2020-03-30 NOTE — Discharge Instructions (Addendum)
Stop taking the keflex. Begin taking the doxycycline and metronidazole twice daily until gone. Begin taking omeprazole once daily to help with acid on the stomach. You can also treat symptoms with Tums or a liquid Mylanta over-the-counter. You can treat your symptoms with Tylenol. You can take Zofran every 8 hours as needed for nausea. Follow with gynecology. Avoid intercourse until you know all of your STD test results. Inform all sexual partners of your positive test result today and any future positive test results. Follow with your primary care provider regarding your longstanding history of anemia and abdominal pain. Return for severely worsening abdominal pain, uncontrollable vomiting, fever, or other concerning symptoms

## 2020-03-30 NOTE — ED Triage Notes (Signed)
Pt states that she was seen at [facility] yesterday was told that she has a UTI reports she has tried to take her antibiotics for it but is unable to tolerate anything by mouth without vomiting. Also reports she has had vaginal discharge since starting her antibiotics.

## 2020-03-30 NOTE — ED Notes (Signed)
Pt getting into gown for exam.

## 2020-03-31 LAB — RPR: RPR Ser Ql: NONREACTIVE

## 2020-04-01 ENCOUNTER — Emergency Department (HOSPITAL_BASED_OUTPATIENT_CLINIC_OR_DEPARTMENT_OTHER)
Admission: EM | Admit: 2020-04-01 | Discharge: 2020-04-01 | Disposition: A | Discharge status: Home / Self Care | Payer: Self-pay | Attending: Emergency Medicine | Admitting: Emergency Medicine

## 2020-04-01 ENCOUNTER — Encounter (HOSPITAL_BASED_OUTPATIENT_CLINIC_OR_DEPARTMENT_OTHER): Payer: Self-pay

## 2020-04-01 ENCOUNTER — Other Ambulatory Visit: Payer: Self-pay

## 2020-04-01 DIAGNOSIS — A599 Trichomoniasis, unspecified: Secondary | ICD-10-CM | POA: Insufficient documentation

## 2020-04-01 DIAGNOSIS — Z87891 Personal history of nicotine dependence: Secondary | ICD-10-CM | POA: Insufficient documentation

## 2020-04-01 DIAGNOSIS — N3000 Acute cystitis without hematuria: Secondary | ICD-10-CM | POA: Insufficient documentation

## 2020-04-01 DIAGNOSIS — E119 Type 2 diabetes mellitus without complications: Secondary | ICD-10-CM | POA: Insufficient documentation

## 2020-04-01 HISTORY — DX: Urinary tract infection, site not specified: N39.0

## 2020-04-01 MED ORDER — ONDANSETRON HCL 4 MG/2ML IJ SOLN
4.0000 mg | Freq: Once | INTRAMUSCULAR | Status: AC
  Administered 2020-04-01: 08:00:00 4 mg via INTRAVENOUS
  Filled 2020-04-01: qty 2

## 2020-04-01 MED ORDER — HYDROCODONE-ACETAMINOPHEN 5-325 MG PO TABS
1.0000 | ORAL_TABLET | Freq: Once | ORAL | Status: AC
  Administered 2020-04-01: 10:00:00 1 via ORAL
  Filled 2020-04-01: qty 1

## 2020-04-01 MED ORDER — HYDROCODONE-ACETAMINOPHEN 5-325 MG PO TABS: 1.0000 | ORAL_TABLET | Freq: Four times a day (QID) | ORAL | 0 refills | Status: AC | PRN

## 2020-04-01 MED ORDER — KETOROLAC TROMETHAMINE 30 MG/ML IJ SOLN
30.0000 mg | Freq: Once | INTRAMUSCULAR | Status: AC
  Administered 2020-04-01: 08:00:00 30 mg via INTRAVENOUS
  Filled 2020-04-01: qty 1

## 2020-04-01 NOTE — Discharge Instructions (Addendum)
Make sure to take the antibiotic that you were prescribed from [facility] (Keflex).  Take the Keflex until it is finished.  Also take the antibiotics that you are prescribed the other day, Flagyl and doxycycline.  Zofran is a medication that you can take for nausea.  I will give a prescription for hydrocodone that you can take for pain.  You can also take over-the-counter ibuprofen for pain that is not as severe.  Follow-up with your primary care doctor to make sure the infection resolves.

## 2020-04-01 NOTE — ED Triage Notes (Signed)
She c/o upper abd. Pain "all night last night". She is tearful and in no distress.

## 2020-04-01 NOTE — ED Provider Notes (Signed)
[facility] EMERGENCY DEPARTMENT Provider Note   CSN: [id number] Arrival date & time: 04/01/20  6160     History Chief Complaint  Patient presents with  . Abdominal Pain    Anna Potter is a 34 y.o. female.  HPI   Patient presents to the emergency room for evaluation of persistent abdominal pain.  Nursing notes indicate the pain is in her upper abdomen but the patient actually shows me that its below her umbilicus bilaterally.  Patient states she was up all night having persistent pain.  She was recently seen in 2 emergency room's in the last couple of days for the same complaint.  Patient was seen at a [facility] emergency room on December 23.  Those records were reviewed and the patient ended up having an evaluation that included laboratory tests as well as a CT scan of her abdomen and pelvis.  The CT scan showed prominent pelvic veins suggesting pelvic congestion syndrome.  Otherwise there were no acute abdominal pelvic findings.  Patient was also diagnosed with UTI and prescribed Keflex.  Patient ended up coming to the ED on the 24th.  Patient had an evaluation here.  During that evaluation patient had a pelvic exam and was noted to have trichomonas as well as clue cells and many white blood cells.  The urinalysis here was normal.  Patient states she has had this persistent pain last night she still hurting.  She has not picked up the antibiotics that she was prescribed from the ED visit on the 24th.  She did stop taking the Keflex but because the urine here was normal and she was instructed to stop taking that.  Patient checked her MyChart from [place] and saw that her urine culture was positive for E. coli.  I have reviewed those sensitivities and it is susceptible to cefazolin.  Patient denies any fever.  No vomiting.  Past Medical History:  Diagnosis Date  . Anemia   . Diabetes mellitus without complication (HCC)    gestational  . GERD (gastroesophageal reflux disease)     with pregnancy  . Headache   . UTI (urinary tract infection)     Patient Active Problem List   Diagnosis Date Noted  . S/P C-section 05/04/2015  . Fetal echogenic bowel of fetus 01/25/2015  . Obesity affecting pregnancy in first trimester 01/15/2015  . Previous cesarean delivery, delivered 01/15/2015  . History of gestational diabetes in prior pregnancy, currently pregnant in first trimester 01/15/2015  . Encounter for supervision of other normal pregnancy in first trimester 01/15/2015  . Late prenatal care in third trimester 01/15/2015    Past Surgical History:  Procedure Laterality Date  . CESAREAN SECTION    . CESAREAN SECTION N/A 05/04/2015   Procedure: CESAREAN SECTION;  Surgeon: Adam Phenix, MD;  Location: [facility];  Service: Obstetrics;  Laterality: N/A;     OB History    Gravida  3   Para  3   Term  3   Preterm      AB      Living  3     SAB      IAB      Ectopic      Multiple  0   Live Births  3           Family History  Problem Relation Age of Onset  . Hypertension Mother   . Diabetes Father     Social History   Tobacco Use  . Smoking  status: Former Smoker    Years: 0.30    Types: E-cigarettes    Quit date: 09/06/2014    Years since quitting: 5.5  . Smokeless tobacco: Never Used  Substance Use Topics  . Alcohol use: Never  . Drug use: No    Home Medications Prior to Admission medications   Medication Sig Start Date End Date Taking? Authorizing Provider  cyclobenzaprine (FLEXERIL) 10 MG tablet Take 1 tablet (10 mg total) by mouth 2 (two) times daily as needed for muscle spasms. 07/17/18   Robinson, [place] N, PA-C  doxycycline (VIBRAMYCIN) 100 MG capsule Take 1 capsule (100 mg total) by mouth 2 (two) times daily for 7 days. 03/30/20 04/06/20  Robinson, [place] N, PA-C  HYDROcodone-acetaminophen (NORCO/VICODIN) 5-325 MG tablet Take 1 tablet by mouth every 6 (six) hours as needed. 04/01/20   Linwood Dibbles, MD  ibuprofen (ADVIL,MOTRIN) 600  MG tablet Take 1 tablet (600 mg total) by mouth every 6 (six) hours as needed for mild pain. 05/07/15   Montez Morita, CNM  metroNIDAZOLE (FLAGYL) 500 MG tablet Take 1 tablet (500 mg total) by mouth 2 (two) times daily. 03/30/20   Robinson, [place] N, PA-C  omeprazole (PRILOSEC) 20 MG capsule Take 1 capsule (20 mg total) by mouth daily. 03/30/20   Robinson, [place] N, PA-C  omeprazole (PRILOSEC) 40 MG capsule Take one capsule daily at least 30 minutes before first dose of Carafate. 08/06/15   Molpus, John, MD  ondansetron (ZOFRAN ODT) 4 MG disintegrating tablet Take 1 tablet (4 mg total) by mouth every 8 (eight) hours as needed for nausea or vomiting. 03/30/20   Robinson, [place] N, PA-C  Prenatal Vit-Fe Fumarate-FA (PRENATAL MULTIVITAMIN) TABS tablet Take 1 tablet by mouth daily at 12 noon. 01/24/15   Allie Bossier, MD  sucralfate (CARAFATE) 1 g tablet Take 1 tablet (1 g total) by mouth 4 (four) times daily -  with meals and at bedtime. 08/06/15   Molpus, John, MD  triamcinolone cream (KENALOG) 0.1 % Apply 1 application topically 2 (two) times daily. 02/21/15   Levie Heritage, DO    Allergies    Patient has no known allergies.  Review of Systems   Review of Systems  All other systems reviewed and are negative.   Physical Exam Updated Vital Signs BP (!) 155/92 (BP Location: Right Arm)   Pulse 89   Temp 97.9 F (36.6 C) (Oral)   Resp 18   LMP 03/16/2020 (Exact Date)   SpO2 99%   Physical Exam Vitals and nursing note reviewed.  Constitutional:      Appearance: She is well-developed and well-nourished.     Comments: Appears to be in pain uncomfortable  HENT:     Head: Normocephalic and atraumatic.     Right Ear: External ear normal.     Left Ear: External ear normal.  Eyes:     General: No scleral icterus.       Right eye: No discharge.        Left eye: No discharge.     Conjunctiva/sclera: Conjunctivae normal.  Neck:     Trachea: No tracheal deviation.  Cardiovascular:     Rate  and Rhythm: Normal rate and regular rhythm.     Pulses: Intact distal pulses.  Pulmonary:     Effort: Pulmonary effort is normal. No respiratory distress.     Breath sounds: Normal breath sounds. No stridor. No wheezing or rales.  Abdominal:     General: Bowel sounds are normal.  There is no distension.     Palpations: Abdomen is soft.     Tenderness: There is abdominal tenderness in the right lower quadrant, suprapubic area and left lower quadrant. There is no guarding or rebound.  Musculoskeletal:        General: No tenderness or edema.     Cervical back: Neck supple.  Skin:    General: Skin is warm and dry.     Findings: No rash.  Neurological:     Mental Status: She is alert.     Cranial Nerves: No cranial nerve deficit (no facial droop, extraocular movements intact, no slurred speech).     Sensory: No sensory deficit.     Motor: No abnormal muscle tone or seizure activity.     Coordination: Coordination normal.     Deep Tendon Reflexes: Strength normal.  Psychiatric:        Mood and Affect: Mood and affect normal.     ED Results / Procedures / Treatments   Labs (all labs ordered are listed, but only abnormal results are displayed) Labs Reviewed - No data to display  EKG None  Radiology No results found.  Procedures Procedures (including critical care time)  Medications Ordered in ED Medications  HYDROcodone-acetaminophen (NORCO/VICODIN) 5-325 MG per tablet 1 tablet (has no administration in time range)  ketorolac (TORADOL) 30 MG/ML injection 30 mg (30 mg Intravenous Given 04/01/20 0812)  ondansetron (ZOFRAN) injection 4 mg (4 mg Intravenous Given 04/01/20 9485)    ED Course  I have reviewed the triage vital signs and the nursing notes.  Pertinent labs & imaging results that were available during my care of the patient were reviewed by me and considered in my medical decision making (see chart for details).    MDM Rules/Calculators/A&P                           Patient presented to the ED for evaluation of persistent abdominal pain.  Patient has had 3 visits to the ED in the last week.  Patient's had an extensive evaluation including CT scans as well as pelvic exam.  Patient has been diagnosed with a UTI as well as trichomonas.  Suspect she could be having a component of PID.  Patient was given Rocephin but she was instructed to take doxycycline and discontinue her Keflex.  Patient stopped her Keflex but did not pick up the doxycycline antibiotic.  Patient was given medications for pain here in the ED.  She is not having any vomiting.  Vital signs are stable.  Patient is afebrile.  We will have her restart her Keflex as her urine culture from [facility] did show E. coli.  Recommend the patient do take the doxycycline and the Flagyl.  I will do a prescription for hydrocodone to help with pain.  Discussed taking ibuprofen and continue taking over-the-counter laxatives as the hydrocodone can constipate her. Final Clinical Impression(s) / ED Diagnoses Final diagnoses:  Acute cystitis without hematuria  Trichomonas infection    Rx / DC Orders ED Discharge Orders         Ordered    HYDROcodone-acetaminophen (NORCO/VICODIN) 5-325 MG tablet  Every 6 hours PRN        04/01/20 4627           Linwood Dibbles, MD 04/01/20 [phone]

## 2020-04-02 LAB — GC/CHLAMYDIA PROBE AMP (~~LOC~~) NOT AT ARMC
Chlamydia: NEGATIVE
Comment: NEGATIVE
Comment: NORMAL
Neisseria Gonorrhea: NEGATIVE

## 2022-12-03 ENCOUNTER — Emergency Department (HOSPITAL_BASED_OUTPATIENT_CLINIC_OR_DEPARTMENT_OTHER)
Admission: EM | Admit: 2022-12-03 | Discharge: 2022-12-03 | Disposition: A | Discharge status: Home / Self Care | Payer: Self-pay | Attending: Emergency Medicine | Admitting: Emergency Medicine

## 2022-12-03 ENCOUNTER — Other Ambulatory Visit: Payer: Self-pay

## 2022-12-03 ENCOUNTER — Emergency Department (HOSPITAL_BASED_OUTPATIENT_CLINIC_OR_DEPARTMENT_OTHER): Payer: Self-pay

## 2022-12-03 ENCOUNTER — Encounter (HOSPITAL_BASED_OUTPATIENT_CLINIC_OR_DEPARTMENT_OTHER): Payer: Self-pay

## 2022-12-03 ENCOUNTER — Telehealth (HOSPITAL_BASED_OUTPATIENT_CLINIC_OR_DEPARTMENT_OTHER): Payer: Self-pay | Admitting: Emergency Medicine

## 2022-12-03 DIAGNOSIS — O039 Complete or unspecified spontaneous abortion without complication: Secondary | ICD-10-CM

## 2022-12-03 DIAGNOSIS — Z3A00 Weeks of gestation of pregnancy not specified: Secondary | ICD-10-CM | POA: Insufficient documentation

## 2022-12-03 DIAGNOSIS — N939 Abnormal uterine and vaginal bleeding, unspecified: Secondary | ICD-10-CM

## 2022-12-03 DIAGNOSIS — O200 Threatened abortion: Secondary | ICD-10-CM | POA: Insufficient documentation

## 2022-12-03 DIAGNOSIS — A599 Trichomoniasis, unspecified: Secondary | ICD-10-CM | POA: Insufficient documentation

## 2022-12-03 LAB — CBC WITH DIFFERENTIAL/PLATELET
Abs Immature Granulocytes: 0.06 10*3/uL (ref 0.00–0.07)
Basophils Absolute: 0 10*3/uL (ref 0.0–0.1)
Basophils Relative: 0 %
Eosinophils Absolute: 0.1 10*3/uL (ref 0.0–0.5)
Eosinophils Relative: 1 %
HCT: 34.9 % — ABNORMAL LOW (ref 36.0–46.0)
Hemoglobin: 11.1 g/dL — ABNORMAL LOW (ref 12.0–15.0)
Immature Granulocytes: 0 %
Lymphocytes Relative: 13 %
Lymphs Abs: 1.9 10*3/uL (ref 0.7–4.0)
MCH: 26.2 pg (ref 26.0–34.0)
MCHC: 31.8 g/dL (ref 30.0–36.0)
MCV: 82.3 fL (ref 80.0–100.0)
Monocytes Absolute: 0.6 10*3/uL (ref 0.1–1.0)
Monocytes Relative: 4 %
Neutro Abs: 12.2 10*3/uL — ABNORMAL HIGH (ref 1.7–7.7)
Neutrophils Relative %: 82 %
Platelets: 243 10*3/uL (ref 150–400)
RBC: 4.24 MIL/uL (ref 3.87–5.11)
RDW: 17.5 % — ABNORMAL HIGH (ref 11.5–15.5)
WBC: 15 10*3/uL — ABNORMAL HIGH (ref 4.0–10.5)
nRBC: 0 % (ref 0.0–0.2)

## 2022-12-03 LAB — COMPREHENSIVE METABOLIC PANEL
ALT: 16 U/L (ref 0–44)
AST: 22 U/L (ref 15–41)
Albumin: 3.6 g/dL (ref 3.5–5.0)
Alkaline Phosphatase: 45 U/L (ref 38–126)
Anion gap: 10 (ref 5–15)
BUN: 8 mg/dL (ref 6–20)
CO2: 21 mmol/L — ABNORMAL LOW (ref 22–32)
Calcium: 9.1 mg/dL (ref 8.9–10.3)
Chloride: 105 mmol/L (ref 98–111)
Creatinine, Ser: 0.54 mg/dL (ref 0.44–1.00)
GFR, Estimated: >60 mL/min (ref >60)
Glucose, Bld: 93 mg/dL (ref 70–99)
Potassium: 3.2 mmol/L — ABNORMAL LOW (ref 3.5–5.1)
Sodium: 136 mmol/L (ref 135–145)
Total Bilirubin: 0.7 mg/dL (ref 0.3–1.2)
Total Protein: 7.8 g/dL (ref 6.5–8.1)

## 2022-12-03 LAB — GC/CHLAMYDIA PROBE AMP (~~LOC~~) NOT AT ARMC
Chlamydia: NEGATIVE
Comment: NEGATIVE
Comment: NORMAL
Neisseria Gonorrhea: NEGATIVE

## 2022-12-03 LAB — WET PREP, GENITAL
Clue Cells Wet Prep HPF POC: NONE SEEN
Sperm: NONE SEEN
WBC, Wet Prep HPF POC: <10 (ref <10)
Yeast Wet Prep HPF POC: NONE SEEN

## 2022-12-03 LAB — LIPASE, BLOOD: Lipase: 35 U/L (ref 11–51)

## 2022-12-03 LAB — HIV ANTIBODY (ROUTINE TESTING W REFLEX): HIV Screen 4th Generation wRfx: NONREACTIVE

## 2022-12-03 LAB — HCG, QUANTITATIVE, PREGNANCY: hCG, Beta Chain, Quant, S: 3789 m[IU]/mL — ABNORMAL HIGH (ref <5)

## 2022-12-03 MED ORDER — METHYLERGONOVINE MALEATE 0.2 MG PO TABS: 0.2000 mg | ORAL_TABLET | ORAL | Status: DC

## 2022-12-03 MED ORDER — METHYLERGONOVINE MALEATE 0.2 MG PO TABS: 0.2000 mg | ORAL_TABLET | Freq: Three times a day (TID) | ORAL | 0 refills | Status: AC

## 2022-12-03 MED ORDER — MORPHINE SULFATE (PF) 4 MG/ML IV SOLN
4.0000 mg | Freq: Once | INTRAVENOUS | Status: AC
  Administered 2022-12-03: 4 mg via INTRAVENOUS
  Filled 2022-12-03: qty 1

## 2022-12-03 MED ORDER — METRONIDAZOLE 500 MG PO TABS: 500.0000 mg | ORAL_TABLET | Freq: Two times a day (BID) | ORAL | 0 refills | Status: AC

## 2022-12-03 MED ORDER — METHYLERGONOVINE MALEATE 0.2 MG/ML IJ SOLN
0.2000 mg | Freq: Once | INTRAMUSCULAR | Status: AC
  Administered 2022-12-03: 0.2 mg via INTRAMUSCULAR
  Filled 2022-12-03: qty 1

## 2022-12-03 MED ORDER — ONDANSETRON HCL 4 MG/2ML IJ SOLN
4.0000 mg | Freq: Once | INTRAMUSCULAR | Status: AC
  Administered 2022-12-03: 4 mg via INTRAVENOUS
  Filled 2022-12-03: qty 2

## 2022-12-03 MED ORDER — TRANEXAMIC ACID-NACL 1000-0.7 MG/100ML-% IV SOLN
1000.0000 mg | INTRAVENOUS | Status: AC
  Administered 2022-12-03: 1000 mg via INTRAVENOUS
  Filled 2022-12-03: qty 100

## 2022-12-03 NOTE — ED Notes (Signed)
Assisted EDP with pelvic exam. EDP was able to visualize placenta and remove it completely. Sent to lab for examination

## 2022-12-03 NOTE — Discharge Instructions (Signed)
You were seen for your vaginal bleeding and miscarriage in the emergency department.   At home, please take Tylenol and ibuprofen for the pain.  Take the Methergine for the next 3 days for your miscarriage.  Take the Flagyl for your trichomonas infection.  Please have any sexual partners treated.  Check your MyChart online for the results of any tests that had not resulted by the time you left the emergency department.   Follow-up with your OB/GYN doctor in 2-3 days regarding your visit.  If you do not have 1 you can use the 1 listed in this packet to set up an appointment.  Return immediately to the emergency department if you experience any of the following: Heavy vaginal bleeding (more than 3 pads an hour for 2 hours), dizziness, fevers, vaginal discharge, or any other concerning symptoms.    Thank you for visiting our Emergency Department. It was a pleasure taking care of you today.

## 2022-12-03 NOTE — ED Provider Notes (Signed)
Anna Potter Provider Note   CSN: [id number] Arrival date & time: 12/03/22  4098     History  Chief Complaint  Patient presents with   Abdominal Pain    With vaginal bleeding     Anna Potter is a 37 y.o. female.  37 year old female G4P3 at approximately 2 months pregnant with history of C-section who presents to the emergency department with vaginal bleeding.  Patient reports that this morning she woke up with lower pelvic pain and vaginal bleeding.  Says that she put a pad on but is now bed through the pad and is having severe abdominal cramping.  LMP was last week of June but is unsure the exact dates.  Does not currently have an OB/GYN that she follows with.  Not on anticoagulation.  No vaginal discharge, fevers, nausea or vomiting, diarrhea before this.  Does not smoke and no history of DVT or PE.       Home Medications Prior to Admission medications   Medication Sig Start Date End Date Taking? Authorizing Provider  methylergonovine (METHERGINE) 0.2 MG tablet Take 1 tablet (0.2 mg total) by mouth every 8 (eight) hours for 3 days. 12/03/22 12/06/22 Yes Rondel Baton, MD  cyclobenzaprine (FLEXERIL) 10 MG tablet Take 1 tablet (10 mg total) by mouth 2 (two) times daily as needed for muscle spasms. 07/17/18   Robinson, [place] N, PA-C  HYDROcodone-acetaminophen (NORCO/VICODIN) 5-325 MG tablet Take 1 tablet by mouth every 6 (six) hours as needed. 04/01/20   Linwood Dibbles, MD  ibuprofen (ADVIL,MOTRIN) 600 MG tablet Take 1 tablet (600 mg total) by mouth every 6 (six) hours as needed for mild pain. 05/07/15   Montez Morita, CNM  metroNIDAZOLE (FLAGYL) 500 MG tablet Take 1 tablet (500 mg total) by mouth 2 (two) times daily. 12/03/22   Rondel Baton, MD  omeprazole (PRILOSEC) 20 MG capsule Take 1 capsule (20 mg total) by mouth daily. 03/30/20   Robinson, [place] N, PA-C  omeprazole (PRILOSEC) 40 MG capsule Take one capsule daily at least  30 minutes before first dose of Carafate. 08/06/15   Molpus, John, MD  ondansetron (ZOFRAN ODT) 4 MG disintegrating tablet Take 1 tablet (4 mg total) by mouth every 8 (eight) hours as needed for nausea or vomiting. 03/30/20   Robinson, [place] N, PA-C  Prenatal Vit-Fe Fumarate-FA (PRENATAL MULTIVITAMIN) TABS tablet Take 1 tablet by mouth daily at 12 noon. 01/24/15   Allie Bossier, MD  sucralfate (CARAFATE) 1 g tablet Take 1 tablet (1 g total) by mouth 4 (four) times daily -  with meals and at bedtime. 08/06/15   Molpus, John, MD  triamcinolone cream (KENALOG) 0.1 % Apply 1 application topically 2 (two) times daily. 02/21/15   Levie Heritage, DO      Allergies    Patient has no known allergies.    Review of Systems   Review of Systems  Physical Exam Updated Vital Signs BP 114/69 (BP Location: Left Arm)   Pulse (!) 59   Temp 98 F (36.7 C) (Axillary)   Resp 16   Ht 5\' 6"  (1.676 m)   Wt 68 kg   LMP 12/03/2022   SpO2 100%   BMI 24.21 kg/m  Physical Exam Vitals and nursing note reviewed.  Constitutional:      General: She is not in acute distress.    Appearance: She is well-developed.     Comments: Vomiting during initial exam  HENT:  Head: Normocephalic and atraumatic.     Right Ear: External ear normal.     Left Ear: External ear normal.     Nose: Nose normal.  Eyes:     Extraocular Movements: Extraocular movements intact.     Conjunctiva/sclera: Conjunctivae normal.     Pupils: Pupils are equal, round, and reactive to light.  Cardiovascular:     Rate and Rhythm: Normal rate and regular rhythm.  Pulmonary:     Effort: Pulmonary effort is normal. No respiratory distress.  Abdominal:     General: Abdomen is flat. There is no distension.     Palpations: Abdomen is soft. There is no mass.     Tenderness: There is abdominal tenderness (lower abdomen). There is no guarding.  Genitourinary:    Comments: Chaperoned by Wallis Mart.  Fetal tissue passed spontaneously prior to pelvic  exam.  During the pelvic exam was found to have a placenta that was removed.  There was a small tear in the placenta.  Bleeding subsequently was minimal.  Cervix was open with small amount of oozing of blood.  No purulent discharge.  No masses palpated on bimanual exam.  No significant CMT or adnexal tenderness. Musculoskeletal:     Cervical back: Normal range of motion and neck supple.     Right lower leg: No edema.     Left lower leg: No edema.  Skin:    General: Skin is warm and dry.  Neurological:     Mental Status: She is alert and oriented to person, place, and time. Mental status is at baseline.  Psychiatric:        Mood and Affect: Mood normal.     ED Results / Procedures / Treatments   Labs (all labs ordered are listed, but only abnormal results are displayed) Labs Reviewed  WET PREP, GENITAL - Abnormal; Notable for the following components:      Result Value   Trich, Wet Prep PRESENT (*)    All other components within normal limits  COMPREHENSIVE METABOLIC PANEL - Abnormal; Notable for the following components:   Potassium 3.2 (*)    CO2 21 (*)    All other components within normal limits  CBC WITH DIFFERENTIAL/PLATELET - Abnormal; Notable for the following components:   WBC 15.0 (*)    Hemoglobin 11.1 (*)    HCT 34.9 (*)    RDW 17.5 (*)    Neutro Abs 12.2 (*)    All other components within normal limits  HCG, QUANTITATIVE, PREGNANCY - Abnormal; Notable for the following components:   hCG, Beta Chain, Quant, S 3,789 (*)    All other components within normal limits  LIPASE, BLOOD  URINALYSIS, ROUTINE W REFLEX MICROSCOPIC  HIV ANTIBODY (ROUTINE TESTING W REFLEX)  RPR  GC/CHLAMYDIA PROBE AMP ([place]) NOT AT [facility]  SURGICAL PATHOLOGY  SURGICAL PATHOLOGY    EKG None  Radiology US OB LESS THAN 14 WEEKS WITH OB TRANSVAGINAL  Result Date: 12/03/2022 CLINICAL DATA:  Vaginal bleeding, missed abortion, concern for retained products of conception EXAM: OBSTETRIC  <14 WK ULTRASOUND TECHNIQUE: Transabdominal ultrasound was performed for evaluation of the gestation as well as the maternal uterus and adnexal regions. COMPARISON:  None Available. FINDINGS: Intrauterine gestational sac: None Yolk sac:  Not Visualized. Embryo:  Not Visualized. Cardiac Activity: Not Visualized. Heart Rate: Not visualized. Subchorionic hemorrhage:  None visualized. Maternal uterus/adnexae: Left corpus luteum. IMPRESSION: 1. No candidate intrauterine gestation by ultrasound. Technically, early pregnancy of unknown location. Recommend ectopic precautions  and serial beta HCG to resolution. 2. No evidence of retained products of conception given stated history of missed abortion. Electronically Signed   By: Jearld Lesch M.D.   On: 12/03/2022 10:52    Procedures Procedures    Medications Ordered in ED Medications  ondansetron (ZOFRAN) injection 4 mg (4 mg Intravenous Given 12/03/22 0854)  morphine (PF) 4 MG/ML injection 4 mg (4 mg Intravenous Given 12/03/22 0856)  tranexamic acid (CYKLOKAPRON) IVPB 1,000 mg (0 mg Intravenous Stopped 12/03/22 1022)  methylergonovine (METHERGINE) injection 0.2 mg (0.2 mg Intramuscular Given 12/03/22 1142)    ED Course/ Medical Decision Making/ A&P Clinical Course as of 12/03/22 1330  Wed Dec 03, 2022  1610 Placenta discovered on pelvic exam.  Does appear to have a small tear. [RP]  1023 Trich, Wet Prep(!): PRESENT [RP]  1100 Dr Donavan Foil from OB/GYN consulted.  Recommends starting the patient on Methergine p.o. every 8 hours for the next 72 hours and having a follow-up as an outpatient. [RP]  1124 Hemoglobin(!): 11.1 Baseline of 10.  [RP]    Clinical Course User Index [RP] Rondel Baton, MD                                 Medical Decision Making Amount and/or Complexity of Data Reviewed Labs: ordered. Decision-making details documented in ED Course. Radiology: ordered.  Risk Prescription drug management.   Taraja Shanor is a 37 year old  female G4P3 at approximately 2 months pregnant with history of C-section who presents to the emergency department with vaginal bleeding  Initial Ddx:  Fibroids, miscarriage, ectopic pregnancy, STI, PID  MDM/Course:  Patient presents to the emergency department after missing her period with vaginal bleeding.  On exam did have passage of fetal tissue as well as a placenta.  No significant discharge or adnexal tenderness that would suggest PID.  Patient had significant amount of bleeding initially that became controlled after the placenta was delivered.  She was also given TXA.  She remained hemodynamically stable in the emergency department.  Labs did show hemoglobin of 11 with a baseline of 10.  Was found to have trichomonas on her wet prep and so was prescribed Flagyl.  Had transvaginal ultrasound that did not show any retained products of conception.  Was discussed with OB/GYN who recommends starting the patient on Methergine for the next 3 days and having her follow-up as an outpatient.  Upon re-evaluation patient's bleeding had improved.  She was able to ambulate about the emergency department without any symptoms of anemia.  Is O+ blood type so RhoGAM not indicated at this time.  Was instructed to follow-up with her OB/GYN in 3 days for repeat hCG check.  Return precautions discussed prior to discharge.  This patient presents to the ED for concern of complaints listed in HPI, this involves an extensive number of treatment options, and is a complaint that carries with it a high risk of complications and morbidity. Disposition including potential need for admission considered.   Dispo: DC Home. Return precautions discussed including, but not limited to, those listed in the AVS. Allowed pt time to ask questions which were answered fully prior to dc.  Records reviewed Outpatient Clinic Notes The following labs were independently interpreted: Chemistry and show no acute abnormality I independently  reviewed the following imaging with scope of interpretation limited to determining acute life threatening conditions related to emergency care:  Pelvic ultrasound  and agree  with the radiologist interpretation with the following exceptions: none I personally reviewed and interpreted cardiac monitoring: normal sinus rhythm  I personally reviewed and interpreted the pt's EKG: see above for interpretation  I have reviewed the patients home medications and made adjustments as needed Consults: OBGYN  Final Clinical Impression(s) / ED Diagnoses Final diagnoses:  Miscarriage  Vaginal bleeding  Trichomonas infection    Rx / DC Orders ED Discharge Orders          Ordered    methylergonovine (METHERGINE) 0.2 MG tablet  Every 8 hours        12/03/22 1215    metroNIDAZOLE (FLAGYL) 500 MG tablet  2 times daily        12/03/22 1215              Rondel Baton, MD 12/03/22 1330

## 2022-12-03 NOTE — ED Triage Notes (Signed)
Pt reports to the ED with complaints of abdominal and pelvic pain. Pt reports that she noticed bleeding. Reports heavy bleeding and that she has bleed through her pants. Prior mentral cycle was about 2 months ago.

## 2022-12-03 NOTE — ED Notes (Signed)
Patient was noted to be lying in bed. EDP had just walked out of the room. This nurse was assisting pt to change out of her underwear. When changing pt this nurse noted a sac with a fetus inside fall out of pt vagina. Pt noted to have medium sized blood clots. Fetus was noted to have full body formation that included hands, legs, full body torso and fully shaped head. This nurse requested that the EDP come back and examine pt. EDP informed pt that he thinks that pt may be having a miscarriage. Fetus was put in a specimen container and transported to lab for evaluation.

## 2022-12-03 NOTE — ED Notes (Signed)
GC swab recollected, and resubmitted. EDP advised he already submitted once.

## 2022-12-03 NOTE — ED Notes (Signed)
Discharge paperwork reviewed entirely with patient, including follow up care. Pain was under control. The patient received instruction and coaching on their prescriptions, and all follow-up questions were answered.  Pt verbalized understanding as well as all parties involved. No questions or concerns voiced at the time of discharge. No acute distress noted.   Pt ambulated out to PVA without incident or assistance.  

## 2022-12-03 NOTE — ED Notes (Signed)
Ambulated patient around nurse station. Pt reported no complaints.

## 2022-12-03 NOTE — ED Notes (Signed)
Called ED Pharmacist about med change for Methergine due to limited pyxis.

## 2022-12-03 NOTE — Telephone Encounter (Signed)
Contacted pt re: GC swab was rejected d/t tube was compromised. The stick part of the swab was poking out of the top on the tube. Pt verbalizes understanding that she will need to have test repeated if she desires. She voices that she will likely go to health department for test.

## 2022-12-04 LAB — RPR: RPR Ser Ql: NONREACTIVE

## 2022-12-11 LAB — SURGICAL PATHOLOGY

## 2023-08-23 ENCOUNTER — Emergency Department (HOSPITAL_BASED_OUTPATIENT_CLINIC_OR_DEPARTMENT_OTHER)
Admission: EM | Admit: 2023-08-23 | Discharge: 2023-08-23 | Disposition: A | Discharge status: Home / Self Care | Payer: Self-pay | Attending: Emergency Medicine | Admitting: Emergency Medicine

## 2023-08-23 ENCOUNTER — Other Ambulatory Visit: Payer: Self-pay

## 2023-08-23 ENCOUNTER — Encounter (HOSPITAL_BASED_OUTPATIENT_CLINIC_OR_DEPARTMENT_OTHER): Payer: Self-pay | Admitting: Emergency Medicine

## 2023-08-23 DIAGNOSIS — Z87891 Personal history of nicotine dependence: Secondary | ICD-10-CM | POA: Diagnosis not present

## 2023-08-23 DIAGNOSIS — R112 Nausea with vomiting, unspecified: Secondary | ICD-10-CM

## 2023-08-23 DIAGNOSIS — N390 Urinary tract infection, site not specified: Secondary | ICD-10-CM | POA: Diagnosis not present

## 2023-08-23 LAB — COMPREHENSIVE METABOLIC PANEL WITH GFR
ALT: 11 U/L (ref 0–44)
AST: 20 U/L (ref 15–41)
Albumin: 4.5 g/dL (ref 3.5–5.0)
Alkaline Phosphatase: 51 U/L (ref 38–126)
Anion gap: 14 (ref 5–15)
BUN: 8 mg/dL (ref 6–20)
CO2: 21 mmol/L — ABNORMAL LOW (ref 22–32)
Calcium: 9 mg/dL (ref 8.9–10.3)
Chloride: 101 mmol/L (ref 98–111)
Creatinine, Ser: 0.67 mg/dL (ref 0.44–1.00)
GFR, Estimated: >60 mL/min (ref >60)
Glucose, Bld: 87 mg/dL (ref 70–99)
Potassium: 3.8 mmol/L (ref 3.5–5.1)
Sodium: 136 mmol/L (ref 135–145)
Total Bilirubin: 0.8 mg/dL (ref 0.0–1.2)
Total Protein: 7.5 g/dL (ref 6.5–8.1)

## 2023-08-23 LAB — CBC WITH DIFFERENTIAL/PLATELET
Abs Immature Granulocytes: 0.03 10*3/uL (ref 0.00–0.07)
Basophils Absolute: 0.1 10*3/uL (ref 0.0–0.1)
Basophils Relative: 1 %
Eosinophils Absolute: 0 10*3/uL (ref 0.0–0.5)
Eosinophils Relative: 0 %
HCT: 34.7 % — ABNORMAL LOW (ref 36.0–46.0)
Hemoglobin: 10.8 g/dL — ABNORMAL LOW (ref 12.0–15.0)
Immature Granulocytes: 0 %
Lymphocytes Relative: 11 %
Lymphs Abs: 1.2 10*3/uL (ref 0.7–4.0)
MCH: 24.3 pg — ABNORMAL LOW (ref 26.0–34.0)
MCHC: 31.1 g/dL (ref 30.0–36.0)
MCV: 78 fL — ABNORMAL LOW (ref 80.0–100.0)
Monocytes Absolute: 0.5 10*3/uL (ref 0.1–1.0)
Monocytes Relative: 5 %
Neutro Abs: 8.8 10*3/uL — ABNORMAL HIGH (ref 1.7–7.7)
Neutrophils Relative %: 83 %
Platelets: 258 10*3/uL (ref 150–400)
RBC: 4.45 MIL/uL (ref 3.87–5.11)
RDW: 18.8 % — ABNORMAL HIGH (ref 11.5–15.5)
WBC: 10.5 10*3/uL (ref 4.0–10.5)
nRBC: 0 % (ref 0.0–0.2)

## 2023-08-23 LAB — WET PREP, GENITAL
Clue Cells Wet Prep HPF POC: NONE SEEN
Sperm: NONE SEEN
Trich, Wet Prep: NONE SEEN
WBC, Wet Prep HPF POC: >=10 — AB (ref <10)
Yeast Wet Prep HPF POC: NONE SEEN

## 2023-08-23 LAB — URINALYSIS, ROUTINE W REFLEX MICROSCOPIC
Bilirubin Urine: NEGATIVE
Glucose, UA: NEGATIVE mg/dL
Ketones, ur: 15 mg/dL — AB
Nitrite: NEGATIVE
Protein, ur: 30 mg/dL — AB
Specific Gravity, Urine: >=1.03 (ref 1.005–1.030)
pH: 6 (ref 5.0–8.0)

## 2023-08-23 LAB — PREGNANCY, URINE: Preg Test, Ur: NEGATIVE

## 2023-08-23 LAB — URINALYSIS, MICROSCOPIC (REFLEX)

## 2023-08-23 LAB — LIPASE, BLOOD: Lipase: 33 U/L (ref 11–51)

## 2023-08-23 MED ORDER — METOCLOPRAMIDE HCL 10 MG PO TABS
10.0000 mg | ORAL_TABLET | Freq: Four times a day (QID) | ORAL | 0 refills | Status: AC
Start: 2023-08-23 — End: ?

## 2023-08-23 MED ORDER — CEPHALEXIN 250 MG PO CAPS
500.0000 mg | ORAL_CAPSULE | Freq: Once | ORAL | Status: AC
  Administered 2023-08-23: 500 mg via ORAL
  Filled 2023-08-23: qty 2

## 2023-08-23 MED ORDER — CEPHALEXIN 500 MG PO CAPS: 500.0000 mg | ORAL_CAPSULE | Freq: Four times a day (QID) | ORAL | 0 refills | Status: AC

## 2023-08-23 NOTE — ED Triage Notes (Signed)
 Pt reports NV, sweating, general malaise x 2d; reports lower middle abd pain

## 2023-08-23 NOTE — ED Provider Notes (Signed)
 [place] [facility] Provider Note   CSN: [id number] Arrival date & time: 08/23/23  1555     History  Chief Complaint  Patient presents with   Emesis   Abdominal Pain    Anna Potter is a 38 y.o. female.  Patient to ED with periumbilical abdominal pain that started 2 days ago. She reports nausea and vomiting but no change in bowel movements. Her son had similar symptoms over last weekend as well. No fever. Denies dysuria but feels she is urinating frequently. No flank or back pain. No cough, SOB.   The history is provided by the patient. No language interpreter was used.  Emesis Associated symptoms: abdominal pain   Abdominal Pain Associated symptoms: vomiting        Home Medications Prior to Admission medications   Medication Sig Start Date End Date Taking? Authorizing Provider  cephALEXin (KEFLEX) 500 MG capsule Take 1 capsule (500 mg total) by mouth 4 (four) times daily. 08/23/23  Yes Lorana Maffeo, Clovis Dar, PA-C  metoCLOPramide  (REGLAN ) 10 MG tablet Take 1 tablet (10 mg total) by mouth every 6 (six) hours. 08/23/23  Yes Sherleen Pangborn, Clovis Dar, PA-C  cyclobenzaprine  (FLEXERIL ) 10 MG tablet Take 1 tablet (10 mg total) by mouth 2 (two) times daily as needed for muscle spasms. 07/17/18   Robinson, Jordan N, PA-C  HYDROcodone -acetaminophen  (NORCO/VICODIN) 5-325 MG tablet Take 1 tablet by mouth every 6 (six) hours as needed. 04/01/20   Trish Furl, MD  ibuprofen  (ADVIL ,MOTRIN ) 600 MG tablet Take 1 tablet (600 mg total) by mouth every 6 (six) hours as needed for mild pain. 05/07/15   Hadassah Letters, CNM  metroNIDAZOLE  (FLAGYL ) 500 MG tablet Take 1 tablet (500 mg total) by mouth 2 (two) times daily. 12/03/22   Ninetta Basket, MD  omeprazole  (PRILOSEC) 20 MG capsule Take 1 capsule (20 mg total) by mouth daily. 03/30/20   Robinson, [place] N, PA-C  omeprazole  (PRILOSEC) 40 MG capsule Take one capsule daily at least 30 minutes before first dose of Carafate .  08/06/15   Molpus, Autry Legions, MD  ondansetron  (ZOFRAN  ODT) 4 MG disintegrating tablet Take 1 tablet (4 mg total) by mouth every 8 (eight) hours as needed for nausea or vomiting. 03/30/20   Robinson, [place] N, PA-C  Prenatal Vit-Fe Fumarate-FA (PRENATAL MULTIVITAMIN) TABS tablet Take 1 tablet by mouth daily at 12 noon. 01/24/15   Ana Balling, MD  sucralfate  (CARAFATE ) 1 g tablet Take 1 tablet (1 g total) by mouth 4 (four) times daily -  with meals and at bedtime. 08/06/15   Molpus, John, MD  triamcinolone  cream (KENALOG ) 0.1 % Apply 1 application topically 2 (two) times daily. 02/21/15   Stinson, Jacob J, DO      Allergies    Patient has no known allergies.    Review of Systems   Review of Systems  Gastrointestinal:  Positive for abdominal pain and vomiting.    Physical Exam Updated Vital Signs BP (!) 141/97   Pulse (!) 54   Temp 98.6 F (37 C) (Oral)   Resp 18   Ht 5\' 7"  (1.702 m)   Wt 65.8 kg   SpO2 100%   BMI 22.71 kg/m  Physical Exam Vitals and nursing note reviewed.  Constitutional:      General: She is not in acute distress.    Appearance: She is well-developed.  Cardiovascular:     Rate and Rhythm: Normal rate and regular rhythm.     Heart sounds: No murmur  heard. Abdominal:     General: Bowel sounds are normal. There is no distension.     Palpations: Abdomen is soft.     Tenderness: There is no abdominal tenderness.  Skin:    General: Skin is warm and dry.  Neurological:     Mental Status: She is alert.     ED Results / Procedures / Treatments   Labs (all labs ordered are listed, but only abnormal results are displayed) Labs Reviewed  WET PREP, GENITAL - Abnormal; Notable for the following components:      Result Value   WBC, Wet Prep HPF POC >=10 (*)    All other components within normal limits  COMPREHENSIVE METABOLIC PANEL WITH GFR - Abnormal; Notable for the following components:   CO2 21 (*)    All other components within normal limits  URINALYSIS, ROUTINE  W REFLEX MICROSCOPIC - Abnormal; Notable for the following components:   Color, Urine AMBER (*)    APPearance HAZY (*)    Hgb urine dipstick LARGE (*)    Ketones, ur 15 (*)    Protein, ur 30 (*)    Leukocytes,Ua TRACE (*)    All other components within normal limits  CBC WITH DIFFERENTIAL/PLATELET - Abnormal; Notable for the following components:   Hemoglobin 10.8 (*)    HCT 34.7 (*)    MCV 78.0 (*)    MCH 24.3 (*)    RDW 18.8 (*)    Neutro Abs 8.8 (*)    All other components within normal limits  URINALYSIS, MICROSCOPIC (REFLEX) - Abnormal; Notable for the following components:   Bacteria, UA FEW (*)    All other components within normal limits  LIPASE, BLOOD  PREGNANCY, URINE  GC/CHLAMYDIA PROBE AMP ([place]) NOT AT [facility]   Results for orders placed or performed during the hospital encounter of 08/23/23  Lipase, blood   Collection Time: 08/23/23  4:19 PM  Result Value Ref Range   Lipase 33 11 - 51 U/L  Comprehensive metabolic panel   Collection Time: 08/23/23  4:19 PM  Result Value Ref Range   Sodium 136 135 - 145 mmol/L   Potassium 3.8 3.5 - 5.1 mmol/L   Chloride 101 98 - 111 mmol/L   CO2 21 (L) 22 - 32 mmol/L   Glucose, Bld 87 70 - 99 mg/dL   BUN 8 6 - 20 mg/dL   Creatinine, Ser 5.95 0.44 - 1.00 mg/dL   Calcium 9.0 8.9 - 63.8 mg/dL   Total Protein 7.5 6.5 - 8.1 g/dL   Albumin 4.5 3.5 - 5.0 g/dL   AST 20 15 - 41 U/L   ALT 11 0 - 44 U/L   Alkaline Phosphatase 51 38 - 126 U/L   Total Bilirubin 0.8 0.0 - 1.2 mg/dL   GFR, Estimated >75 >64 mL/min   Anion gap 14 5 - 15  Urinalysis, Routine w reflex microscopic -Urine, Clean Catch   Collection Time: 08/23/23  4:19 PM  Result Value Ref Range   Color, Urine AMBER (A) YELLOW   APPearance HAZY (A) CLEAR   Specific Gravity, Urine >=1.030 1.005 - 1.030   pH 6.0 5.0 - 8.0   Glucose, UA NEGATIVE NEGATIVE mg/dL   Hgb urine dipstick LARGE (A) NEGATIVE   Bilirubin Urine NEGATIVE NEGATIVE   Ketones, ur 15 (A) NEGATIVE  mg/dL   Protein, ur 30 (A) NEGATIVE mg/dL   Nitrite NEGATIVE NEGATIVE   Leukocytes,Ua TRACE (A) NEGATIVE  Pregnancy, urine   Collection Time: 08/23/23  4:19 PM  Result Value Ref Range   Preg Test, Ur NEGATIVE NEGATIVE  CBC with Differential   Collection Time: 08/23/23  4:19 PM  Result Value Ref Range   WBC 10.5 4.0 - 10.5 K/uL   RBC 4.45 3.87 - 5.11 MIL/uL   Hemoglobin 10.8 (L) 12.0 - 15.0 g/dL   HCT 86.5 (L) 78.4 - 69.6 %   MCV 78.0 (L) 80.0 - 100.0 fL   MCH 24.3 (L) 26.0 - 34.0 pg   MCHC 31.1 30.0 - 36.0 g/dL   RDW 29.5 (H) 28.4 - 13.2 %   Platelets 258 150 - 400 K/uL   nRBC 0.0 0.0 - 0.2 %   Neutrophils Relative % 83 %   Neutro Abs 8.8 (H) 1.7 - 7.7 K/uL   Lymphocytes Relative 11 %   Lymphs Abs 1.2 0.7 - 4.0 K/uL   Monocytes Relative 5 %   Monocytes Absolute 0.5 0.1 - 1.0 K/uL   Eosinophils Relative 0 %   Eosinophils Absolute 0.0 0.0 - 0.5 K/uL   Basophils Relative 1 %   Basophils Absolute 0.1 0.0 - 0.1 K/uL   Immature Granulocytes 0 %   Abs Immature Granulocytes 0.03 0.00 - 0.07 K/uL  Urinalysis, Microscopic (reflex)   Collection Time: 08/23/23  4:19 PM  Result Value Ref Range   RBC / HPF 21-50 0 - 5 RBC/hpf   WBC, UA 0-5 0 - 5 WBC/hpf   Bacteria, UA FEW (A) NONE SEEN   Squamous Epithelial / HPF 0-5 0 - 5 /HPF  Wet prep, genital   Collection Time: 08/23/23  6:08 PM   Specimen: Vaginal  Result Value Ref Range   Yeast Wet Prep HPF POC NONE SEEN NONE SEEN   Trich, Wet Prep NONE SEEN NONE SEEN   Clue Cells Wet Prep HPF POC NONE SEEN NONE SEEN   WBC, Wet Prep HPF POC >=10 (A) <10   Sperm NONE SEEN     EKG None  Radiology No results found.  Procedures Procedures    Medications Ordered in ED Medications  cephALEXin (KEFLEX) capsule 500 mg (500 mg Oral Given 08/23/23 1907)    ED Course/ Medical Decision Making/ A&P                                 Medical Decision Making This patient presents to the ED for concern of abdominal pain, this involves an  extensive number of treatment options, and is a complaint that carries with it a high risk of complications and morbidity.  The differential diagnosis includes obstruction, colitis, viral GE, ischemic bowel, PUD, pancreatitis, appendicitis, diverticulitis.    Co morbidities that complicate the patient evaluation  H/O gestational DM, GERD, UTI Past Surgical History - c-section   Additional history obtained:  Additional history and/or information obtained from chart review, notable for history of abdominal issues (nausea, vomiting, gastritis, cystitis)   Lab Tests:  I Ordered, and personally interpreted labs.  The pertinent results include:  no electrolyte abnormalities, normal renal function, lipase normal; UA few bacteria, 6-10 RBCs, 15 ketones, protein positive; pregnancy negative; no leukocytosis, hgb 10.8 (history of anemia); wet prep +WBC, negative otherwise, GC/chlamydia pending.     Imaging Studies ordered:  I ordered imaging studies including n/a I independently visualized and interpreted imaging which showed n/a I agree with the radiologist interpretation   Cardiac Monitoring:  The patient was maintained on a cardiac monitor.  I personally  viewed and interpreted the cardiac monitored which showed an underlying rhythm of: n/a   Medicines ordered and prescription drug management:  I ordered medication including Keflex  for UTI Reevaluation of the patient after these medicines showed that the patient No immediate improvement expected.  Discussed treatment with Reglan  but patient is driving and is willing to wait until she gets home to start this medication. I have reviewed the patients home medicines and have made adjustments as needed   Test Considered:  Na/   Critical Interventions:  N/a   Consultations Obtained:  I requested consultation with the n/a,  and discussed lab and imaging findings as well as pertinent plan - they recommend: n/a   Problem List /  ED Course:  Here with intermittent, cramping type abdominal pain x 2 days.  Exposure to viral GI bug in the home earlier in the week.  No fever, no leukocytosis Benign abdominal exam initially, cramping returned later - soft abdomen at that time. Will treat abnormal urine with symptoms of urinary frequency - Keflex. Discussed that this may be an incidental finding and she is still affected by GI illness. Recommended follow up with PCP for recheck. Discussed return precautions.  Patient comfortable with discharge home.    Reevaluation:  After the interventions noted above, I reevaluated the patient and found that they have :stayed the same   Social Determinants of Health:  Former smoker   Disposition:  After consideration of the diagnostic results and the patients response to treatment, I feel that the patient would benefit from discharge home.   Amount and/or Complexity of Data Reviewed Labs: ordered.  Risk Prescription drug management.           Final Clinical Impression(s) / ED Diagnoses Final diagnoses:  Nausea and vomiting, unspecified vomiting type  Lower urinary tract infectious disease    Rx / DC Orders ED Discharge Orders          Ordered    cephALEXin (KEFLEX) 500 MG capsule  4 times daily        08/23/23 1902    metoCLOPramide  (REGLAN ) 10 MG tablet  Every 6 hours        08/23/23 1902              Mandy Second, PA-C 08/23/23 1914    Lowery Rue, DO 08/23/23 2007

## 2023-08-23 NOTE — Discharge Instructions (Signed)
 As we discussed, you have evidence of a urinary tract infection and a prescription for an antibiotic has been provided to your pharmacy.   With your recent exposure to a GI 'bug', your symptoms may also be related to that. You may experience some diarrhea over the next day or so. Take Reglan  as prescribed that should help with abdominal cramping and nausea.   If you develop any fever, severe pain, bloody stools, or other concerning or worsening symptom, return to the ED for further evaluation.

## 2023-08-24 LAB — GC/CHLAMYDIA PROBE AMP (~~LOC~~) NOT AT ARMC
Chlamydia: NEGATIVE
Comment: NEGATIVE
Comment: NORMAL
Neisseria Gonorrhea: NEGATIVE
